# Patient Record
Sex: Female | Born: 1935 | Race: White | Hispanic: No | Marital: Married
Health system: Southern US, Community
[De-identification: ages and names within clinical notes are randomized; demographics above are authoritative.]

---

## 1998-02-20 ENCOUNTER — Other Ambulatory Visit: Admission: RE | Admit: 1998-02-20 | Discharge: 1998-02-20 | Payer: Self-pay | Admitting: Gynecology

## 1999-01-30 ENCOUNTER — Encounter: Admission: RE | Admit: 1999-01-30 | Discharge: 1999-01-30 | Payer: Self-pay | Admitting: Gynecology

## 1999-01-30 ENCOUNTER — Encounter: Payer: Self-pay | Admitting: Gynecology

## 1999-02-04 ENCOUNTER — Other Ambulatory Visit: Admission: RE | Admit: 1999-02-04 | Discharge: 1999-02-04 | Payer: Self-pay | Admitting: Gynecology

## 1999-03-18 ENCOUNTER — Encounter: Payer: Self-pay | Admitting: Gynecology

## 1999-03-18 ENCOUNTER — Encounter: Admission: RE | Admit: 1999-03-18 | Discharge: 1999-03-18 | Payer: Self-pay | Admitting: Gynecology

## 2000-02-16 ENCOUNTER — Other Ambulatory Visit: Admission: RE | Admit: 2000-02-16 | Discharge: 2000-02-16 | Payer: Self-pay | Admitting: Gynecology

## 2000-03-18 ENCOUNTER — Encounter: Admission: RE | Admit: 2000-03-18 | Discharge: 2000-03-18 | Payer: Self-pay | Admitting: Gynecology

## 2000-03-18 ENCOUNTER — Encounter: Payer: Self-pay | Admitting: Gynecology

## 2001-03-01 ENCOUNTER — Other Ambulatory Visit: Admission: RE | Admit: 2001-03-01 | Discharge: 2001-03-01 | Payer: Self-pay | Admitting: Gynecology

## 2001-03-28 ENCOUNTER — Encounter: Payer: Self-pay | Admitting: Gynecology

## 2001-03-28 ENCOUNTER — Encounter: Admission: RE | Admit: 2001-03-28 | Discharge: 2001-03-28 | Payer: Self-pay | Admitting: Gynecology

## 2002-03-31 ENCOUNTER — Encounter: Admission: RE | Admit: 2002-03-31 | Discharge: 2002-03-31 | Payer: Self-pay | Admitting: Gynecology

## 2002-03-31 ENCOUNTER — Encounter: Payer: Self-pay | Admitting: Gynecology

## 2002-04-10 ENCOUNTER — Other Ambulatory Visit: Admission: RE | Admit: 2002-04-10 | Discharge: 2002-04-10 | Payer: Self-pay | Admitting: Gynecology

## 2003-05-23 ENCOUNTER — Encounter: Admission: RE | Admit: 2003-05-23 | Discharge: 2003-05-23 | Payer: Self-pay | Admitting: Gynecology

## 2004-04-21 ENCOUNTER — Other Ambulatory Visit: Admission: RE | Admit: 2004-04-21 | Discharge: 2004-04-21 | Payer: Self-pay | Admitting: Gynecology

## 2004-07-02 ENCOUNTER — Encounter: Admission: RE | Admit: 2004-07-02 | Discharge: 2004-07-02 | Payer: Self-pay | Admitting: Gynecology

## 2005-01-29 ENCOUNTER — Ambulatory Visit: Payer: Self-pay | Admitting: Family Medicine

## 2005-01-30 ENCOUNTER — Ambulatory Visit: Payer: Self-pay | Admitting: Family Medicine

## 2005-02-02 ENCOUNTER — Ambulatory Visit: Payer: Self-pay | Admitting: Family Medicine

## 2005-02-05 ENCOUNTER — Ambulatory Visit: Payer: Self-pay | Admitting: Family Medicine

## 2005-02-16 ENCOUNTER — Ambulatory Visit: Payer: Self-pay | Admitting: Family Medicine

## 2005-03-16 ENCOUNTER — Ambulatory Visit: Payer: Self-pay | Admitting: Family Medicine

## 2005-06-18 ENCOUNTER — Ambulatory Visit: Payer: Self-pay | Admitting: Family Medicine

## 2005-07-06 ENCOUNTER — Ambulatory Visit: Payer: Self-pay | Admitting: Family Medicine

## 2005-07-08 ENCOUNTER — Encounter: Admission: RE | Admit: 2005-07-08 | Discharge: 2005-07-08 | Payer: Self-pay | Admitting: Gynecology

## 2005-07-30 ENCOUNTER — Encounter: Admission: RE | Admit: 2005-07-30 | Discharge: 2005-07-30 | Payer: Self-pay | Admitting: Gynecology

## 2006-01-12 DIAGNOSIS — J309 Allergic rhinitis, unspecified: Secondary | ICD-10-CM | POA: Insufficient documentation

## 2006-01-12 DIAGNOSIS — M810 Age-related osteoporosis without current pathological fracture: Secondary | ICD-10-CM | POA: Insufficient documentation

## 2006-01-12 DIAGNOSIS — I10 Essential (primary) hypertension: Secondary | ICD-10-CM

## 2006-01-12 DIAGNOSIS — J189 Pneumonia, unspecified organism: Secondary | ICD-10-CM

## 2006-04-28 ENCOUNTER — Other Ambulatory Visit: Admission: RE | Admit: 2006-04-28 | Discharge: 2006-04-28 | Payer: Self-pay | Admitting: Gynecology

## 2006-04-28 ENCOUNTER — Encounter: Payer: Self-pay | Admitting: Family Medicine

## 2006-04-28 LAB — CONVERTED CEMR LAB

## 2006-04-30 ENCOUNTER — Encounter: Payer: Self-pay | Admitting: Family Medicine

## 2006-10-06 ENCOUNTER — Encounter: Admission: RE | Admit: 2006-10-06 | Discharge: 2006-10-06 | Payer: Self-pay | Admitting: Gynecology

## 2007-10-21 ENCOUNTER — Encounter: Admission: RE | Admit: 2007-10-21 | Discharge: 2007-10-21 | Payer: Self-pay | Admitting: Gynecology

## 2009-02-26 ENCOUNTER — Encounter: Admission: RE | Admit: 2009-02-26 | Discharge: 2009-02-26 | Payer: Self-pay | Admitting: Gynecology

## 2014-08-10 ENCOUNTER — Inpatient Hospital Stay (HOSPITAL_COMMUNITY)
Admission: EM | Admit: 2014-08-10 | Discharge: 2014-08-14 | DRG: 064 | Disposition: A | Discharge status: Hospice | Payer: Medicare Other | Attending: Neurology | Admitting: Neurology

## 2014-08-10 ENCOUNTER — Emergency Department (HOSPITAL_COMMUNITY): Payer: Medicare Other

## 2014-08-10 ENCOUNTER — Encounter (HOSPITAL_COMMUNITY): Payer: Self-pay | Admitting: Radiology

## 2014-08-10 ENCOUNTER — Inpatient Hospital Stay (HOSPITAL_COMMUNITY): Payer: Medicare Other

## 2014-08-10 DIAGNOSIS — I619 Nontraumatic intracerebral hemorrhage, unspecified: Secondary | ICD-10-CM | POA: Diagnosis present

## 2014-08-10 DIAGNOSIS — G935 Compression of brain: Secondary | ICD-10-CM | POA: Diagnosis present

## 2014-08-10 DIAGNOSIS — I615 Nontraumatic intracerebral hemorrhage, intraventricular: Principal | ICD-10-CM | POA: Diagnosis present

## 2014-08-10 DIAGNOSIS — Z9114 Patient's other noncompliance with medication regimen: Secondary | ICD-10-CM | POA: Diagnosis present

## 2014-08-10 DIAGNOSIS — Z7982 Long term (current) use of aspirin: Secondary | ICD-10-CM

## 2014-08-10 DIAGNOSIS — Z515 Encounter for palliative care: Secondary | ICD-10-CM

## 2014-08-10 DIAGNOSIS — G936 Cerebral edema: Secondary | ICD-10-CM | POA: Diagnosis present

## 2014-08-10 DIAGNOSIS — Z9289 Personal history of other medical treatment: Secondary | ICD-10-CM

## 2014-08-10 DIAGNOSIS — R609 Edema, unspecified: Secondary | ICD-10-CM | POA: Diagnosis present

## 2014-08-10 DIAGNOSIS — I610 Nontraumatic intracerebral hemorrhage in hemisphere, subcortical: Secondary | ICD-10-CM | POA: Diagnosis not present

## 2014-08-10 DIAGNOSIS — J9690 Respiratory failure, unspecified, unspecified whether with hypoxia or hypercapnia: Secondary | ICD-10-CM | POA: Diagnosis not present

## 2014-08-10 DIAGNOSIS — E876 Hypokalemia: Secondary | ICD-10-CM | POA: Diagnosis present

## 2014-08-10 DIAGNOSIS — I613 Nontraumatic intracerebral hemorrhage in brain stem: Secondary | ICD-10-CM | POA: Diagnosis not present

## 2014-08-10 DIAGNOSIS — I10 Essential (primary) hypertension: Secondary | ICD-10-CM | POA: Diagnosis present

## 2014-08-10 DIAGNOSIS — R4182 Altered mental status, unspecified: Secondary | ICD-10-CM | POA: Diagnosis present

## 2014-08-10 DIAGNOSIS — R001 Bradycardia, unspecified: Secondary | ICD-10-CM | POA: Diagnosis not present

## 2014-08-10 DIAGNOSIS — E119 Type 2 diabetes mellitus without complications: Secondary | ICD-10-CM | POA: Diagnosis present

## 2014-08-10 DIAGNOSIS — J9600 Acute respiratory failure, unspecified whether with hypoxia or hypercapnia: Secondary | ICD-10-CM | POA: Diagnosis present

## 2014-08-10 DIAGNOSIS — Z4659 Encounter for fitting and adjustment of other gastrointestinal appliance and device: Secondary | ICD-10-CM

## 2014-08-10 DIAGNOSIS — Z87898 Personal history of other specified conditions: Secondary | ICD-10-CM

## 2014-08-10 DIAGNOSIS — R40243 Glasgow coma scale score 3-8: Secondary | ICD-10-CM | POA: Diagnosis present

## 2014-08-10 DIAGNOSIS — G8191 Hemiplegia, unspecified affecting right dominant side: Secondary | ICD-10-CM | POA: Diagnosis present

## 2014-08-10 DIAGNOSIS — Z885 Allergy status to narcotic agent status: Secondary | ICD-10-CM

## 2014-08-10 DIAGNOSIS — Z66 Do not resuscitate: Secondary | ICD-10-CM | POA: Diagnosis present

## 2014-08-10 DIAGNOSIS — G9340 Encephalopathy, unspecified: Secondary | ICD-10-CM | POA: Diagnosis present

## 2014-08-10 LAB — POCT I-STAT 3, ART BLOOD GAS (G3+)
Acid-Base Excess: 3 mmol/L — ABNORMAL HIGH (ref 0.0–2.0)
Bicarbonate: 28.2 mEq/L — ABNORMAL HIGH (ref 20.0–24.0)
O2 Saturation: 100 %
PCO2 ART: 43.8 mmHg (ref 35.0–45.0)
PO2 ART: 218 mmHg — AB (ref 80.0–100.0)
Patient temperature: 97
TCO2: 30 mmol/L (ref 0–100)
pH, Arterial: 7.412 (ref 7.350–7.450)

## 2014-08-10 LAB — CBC
HEMATOCRIT: 42.4 % (ref 36.0–46.0)
Hemoglobin: 14.4 g/dL (ref 12.0–15.0)
MCH: 29 pg (ref 26.0–34.0)
MCHC: 34 g/dL (ref 30.0–36.0)
MCV: 85.5 fL (ref 78.0–100.0)
PLATELETS: 136 10*3/uL — AB (ref 150–400)
RBC: 4.96 MIL/uL (ref 3.87–5.11)
RDW: 15.2 % (ref 11.5–15.5)
WBC: 9.2 10*3/uL (ref 4.0–10.5)

## 2014-08-10 LAB — COMPREHENSIVE METABOLIC PANEL
ALBUMIN: 4.1 g/dL (ref 3.5–5.0)
ALK PHOS: 81 U/L (ref 38–126)
ALT: 22 U/L (ref 14–54)
AST: 38 U/L (ref 15–41)
Anion gap: 10 (ref 5–15)
BILIRUBIN TOTAL: 0.7 mg/dL (ref 0.3–1.2)
BUN: 14 mg/dL (ref 6–20)
CALCIUM: 8.8 mg/dL — AB (ref 8.9–10.3)
CO2: 28 mmol/L (ref 22–32)
Chloride: 101 mmol/L (ref 101–111)
Creatinine, Ser: 0.78 mg/dL (ref 0.44–1.00)
GFR calc Af Amer: >60 mL/min (ref >60)
Glucose, Bld: 137 mg/dL — ABNORMAL HIGH (ref 70–99)
Potassium: 3.1 mmol/L — ABNORMAL LOW (ref 3.5–5.1)
SODIUM: 139 mmol/L (ref 135–145)
TOTAL PROTEIN: 6.5 g/dL (ref 6.5–8.1)

## 2014-08-10 LAB — I-STAT CHEM 8, ED
BUN: 19 mg/dL (ref 6–20)
CALCIUM ION: 1.06 mmol/L — AB (ref 1.13–1.30)
CHLORIDE: 98 mmol/L — AB (ref 101–111)
CREATININE: 0.8 mg/dL (ref 0.44–1.00)
GLUCOSE: 137 mg/dL — AB (ref 70–99)
HCT: 46 % (ref 36.0–46.0)
HEMOGLOBIN: 15.6 g/dL — AB (ref 12.0–15.0)
POTASSIUM: 3 mmol/L — AB (ref 3.5–5.1)
Sodium: 140 mmol/L (ref 135–145)
TCO2: 25 mmol/L (ref 0–100)

## 2014-08-10 LAB — URINE MICROSCOPIC-ADD ON

## 2014-08-10 LAB — DIFFERENTIAL
Basophils Absolute: 0 10*3/uL (ref 0.0–0.1)
Basophils Relative: 0 % (ref 0–1)
EOS ABS: 0 10*3/uL (ref 0.0–0.7)
EOS PCT: 0 % (ref 0–5)
LYMPHS ABS: 3 10*3/uL (ref 0.7–4.0)
LYMPHS PCT: 33 % (ref 12–46)
MONOS PCT: 4 % (ref 3–12)
Monocytes Absolute: 0.4 10*3/uL (ref 0.1–1.0)
Neutro Abs: 5.8 10*3/uL (ref 1.7–7.7)
Neutrophils Relative %: 63 % (ref 43–77)

## 2014-08-10 LAB — URINALYSIS, ROUTINE W REFLEX MICROSCOPIC
Bilirubin Urine: NEGATIVE
Glucose, UA: 100 mg/dL — AB
KETONES UR: NEGATIVE mg/dL
LEUKOCYTES UA: NEGATIVE
NITRITE: NEGATIVE
PROTEIN: NEGATIVE mg/dL
Specific Gravity, Urine: 1.006 (ref 1.005–1.030)
UROBILINOGEN UA: 0.2 mg/dL (ref 0.0–1.0)
pH: 8 (ref 5.0–8.0)

## 2014-08-10 LAB — I-STAT TROPONIN, ED: TROPONIN I, POC: 0 ng/mL (ref 0.00–0.08)

## 2014-08-10 LAB — APTT: aPTT: 31 seconds (ref 24–37)

## 2014-08-10 LAB — SODIUM
Sodium: 137 mmol/L (ref 135–145)
Sodium: 151 mmol/L — ABNORMAL HIGH (ref 135–145)
Sodium: 152 mmol/L — ABNORMAL HIGH (ref 135–145)

## 2014-08-10 LAB — I-STAT ARTERIAL BLOOD GAS, ED
ACID-BASE EXCESS: 1 mmol/L (ref 0.0–2.0)
BICARBONATE: 28.6 meq/L — AB (ref 20.0–24.0)
O2 SAT: 99 %
TCO2: 30 mmol/L (ref 0–100)
pCO2 arterial: 57.5 mmHg (ref 35.0–45.0)
pH, Arterial: 7.305 — ABNORMAL LOW (ref 7.350–7.450)
pO2, Arterial: 174 mmHg — ABNORMAL HIGH (ref 80.0–100.0)

## 2014-08-10 LAB — ETHANOL: Alcohol, Ethyl (B): <5 mg/dL (ref <5)

## 2014-08-10 LAB — MRSA PCR SCREENING: MRSA BY PCR: NEGATIVE

## 2014-08-10 LAB — CBG MONITORING, ED: GLUCOSE-CAPILLARY: 135 mg/dL — AB (ref 70–99)

## 2014-08-10 LAB — PROTIME-INR
INR: 1.04 (ref 0.00–1.49)
Prothrombin Time: 13.7 seconds (ref 11.6–15.2)

## 2014-08-10 LAB — RAPID URINE DRUG SCREEN, HOSP PERFORMED
AMPHETAMINES: NOT DETECTED
Barbiturates: NOT DETECTED
Benzodiazepines: NOT DETECTED
COCAINE: NOT DETECTED
OPIATES: NOT DETECTED
TETRAHYDROCANNABINOL: NOT DETECTED

## 2014-08-10 LAB — POC OCCULT BLOOD, ED: FECAL OCCULT BLD: NEGATIVE

## 2014-08-10 LAB — TRIGLYCERIDES: Triglycerides: 64 mg/dL (ref <150)

## 2014-08-10 MED ORDER — POTASSIUM CHLORIDE 20 MEQ/15ML (10%) PO SOLN
40.0000 meq | Freq: Once | ORAL | Status: AC
  Administered 2014-08-10: 40 meq
  Filled 2014-08-10: qty 30

## 2014-08-10 MED ORDER — PANTOPRAZOLE SODIUM 40 MG IV SOLR: 40.0000 mg | Freq: Every day | INTRAVENOUS | Status: DC

## 2014-08-10 MED ORDER — NICARDIPINE HCL IN NACL 20-0.86 MG/200ML-% IV SOLN
3.0000 mg/h | INTRAVENOUS | Status: DC
  Administered 2014-08-10 (×2): 5 mg/h via INTRAVENOUS
  Filled 2014-08-10 (×2): qty 200

## 2014-08-10 MED ORDER — ACETAMINOPHEN 325 MG PO TABS: 650.0000 mg | ORAL_TABLET | ORAL | Status: DC | PRN

## 2014-08-10 MED ORDER — PROPOFOL 1000 MG/100ML IV EMUL
INTRAVENOUS | Status: AC
  Filled 2014-08-10: qty 100

## 2014-08-10 MED ORDER — LABETALOL HCL 5 MG/ML IV SOLN
10.0000 mg | INTRAVENOUS | Status: DC | PRN
  Administered 2014-08-10 (×2): 20 mg via INTRAVENOUS
  Filled 2014-08-10 (×2): qty 4

## 2014-08-10 MED ORDER — FENTANYL CITRATE (PF) 100 MCG/2ML IJ SOLN
50.0000 ug | INTRAMUSCULAR | Status: DC | PRN
  Administered 2014-08-10: 50 ug via INTRAVENOUS
  Filled 2014-08-10: qty 2

## 2014-08-10 MED ORDER — SODIUM CHLORIDE 3 % IV SOLN
INTRAVENOUS | Status: DC
  Administered 2014-08-10 – 2014-08-11 (×3): 75 mL/h via INTRAVENOUS
  Filled 2014-08-10 (×9): qty 500

## 2014-08-10 MED ORDER — PANTOPRAZOLE SODIUM 40 MG IV SOLR
40.0000 mg | Freq: Every day | INTRAVENOUS | Status: DC
  Administered 2014-08-10: 40 mg via INTRAVENOUS
  Filled 2014-08-10 (×3): qty 40

## 2014-08-10 MED ORDER — PROPOFOL 1000 MG/100ML IV EMUL
5.0000 ug/kg/min | INTRAVENOUS | Status: DC
  Administered 2014-08-10: 5 ug/kg/min via INTRAVENOUS

## 2014-08-10 MED ORDER — CHLORHEXIDINE GLUCONATE 0.12 % MT SOLN
15.0000 mL | Freq: Two times a day (BID) | OROMUCOSAL | Status: DC
  Administered 2014-08-10 – 2014-08-11 (×3): 15 mL via OROMUCOSAL
  Filled 2014-08-10 (×3): qty 15

## 2014-08-10 MED ORDER — ACETAMINOPHEN 650 MG RE SUPP: 650.0000 mg | RECTAL | Status: DC | PRN

## 2014-08-10 MED ORDER — POTASSIUM CHLORIDE 20 MEQ/15ML (10%) PO SOLN
40.0000 meq | Freq: Three times a day (TID) | ORAL | Status: AC
  Administered 2014-08-10 (×2): 40 meq
  Filled 2014-08-10 (×2): qty 30

## 2014-08-10 MED ORDER — SODIUM CHLORIDE 0.9 % IV SOLN
1.0000 g | Freq: Once | INTRAVENOUS | Status: AC
  Administered 2014-08-10: 1 g via INTRAVENOUS
  Filled 2014-08-10: qty 10

## 2014-08-10 MED ORDER — ROCURONIUM BROMIDE 50 MG/5ML IV SOLN
INTRAVENOUS | Status: AC | PRN
  Administered 2014-08-10: 50 mg via INTRAVENOUS

## 2014-08-10 MED ORDER — PROPOFOL 1000 MG/100ML IV EMUL: 0.0000 ug/kg/min | INTRAVENOUS | Status: DC

## 2014-08-10 MED ORDER — SODIUM CHLORIDE 23.4 % INJECTION (4 MEQ/ML) FOR IV ADMINISTRATION
30.0000 mL | Freq: Once | INTRAVENOUS | Status: AC
  Administered 2014-08-10: 30 mL via INTRAVENOUS
  Filled 2014-08-10: qty 30

## 2014-08-10 MED ORDER — FENTANYL BOLUS VIA INFUSION
25.0000 ug | INTRAVENOUS | Status: DC | PRN
  Filled 2014-08-10: qty 25

## 2014-08-10 MED ORDER — PROPOFOL 1000 MG/100ML IV EMUL: 5.0000 ug/kg/min | INTRAVENOUS | Status: DC

## 2014-08-10 MED ORDER — SODIUM CHLORIDE 0.9 % IV SOLN: INTRAVENOUS | Status: DC

## 2014-08-10 MED ORDER — PROPOFOL 1000 MG/100ML IV EMUL
INTRAVENOUS | Status: AC | PRN
  Administered 2014-08-10: 5 ug/kg/min via INTRAVENOUS

## 2014-08-10 MED ORDER — NICARDIPINE HCL IN NACL 20-0.86 MG/200ML-% IV SOLN
3.0000 mg/h | Freq: Once | INTRAVENOUS | Status: AC
  Administered 2014-08-10: 5 mg/h via INTRAVENOUS

## 2014-08-10 MED ORDER — CETYLPYRIDINIUM CHLORIDE 0.05 % MT LIQD
7.0000 mL | Freq: Four times a day (QID) | OROMUCOSAL | Status: DC
  Administered 2014-08-10 – 2014-08-11 (×4): 7 mL via OROMUCOSAL

## 2014-08-10 MED ORDER — STROKE: EARLY STAGES OF RECOVERY BOOK
Freq: Once | Status: AC
  Administered 2014-08-10: 05:00:00
  Filled 2014-08-10: qty 1

## 2014-08-10 MED ORDER — FENTANYL CITRATE (PF) 100 MCG/2ML IJ SOLN
50.0000 ug | INTRAMUSCULAR | Status: DC | PRN
  Administered 2014-08-10 (×2): 50 ug via INTRAVENOUS
  Filled 2014-08-10 (×2): qty 2

## 2014-08-10 MED ORDER — ETOMIDATE 2 MG/ML IV SOLN
INTRAVENOUS | Status: AC | PRN
  Administered 2014-08-10: 20 mg via INTRAVENOUS

## 2014-08-10 MED ORDER — SODIUM CHLORIDE 0.9 % IV SOLN
25.0000 ug/h | INTRAVENOUS | Status: DC
  Administered 2014-08-10: 50 ug/h via INTRAVENOUS
  Filled 2014-08-10: qty 50

## 2014-08-10 MED ORDER — SENNOSIDES-DOCUSATE SODIUM 8.6-50 MG PO TABS
1.0000 | ORAL_TABLET | Freq: Two times a day (BID) | ORAL | Status: DC
  Administered 2014-08-10 – 2014-08-11 (×2): 1 via ORAL
  Filled 2014-08-10 (×4): qty 1

## 2014-08-10 NOTE — Code Documentation (Signed)
Pt transported to CT by Carollee SiresSteve Snider, RN

## 2014-08-10 NOTE — Progress Notes (Signed)
Sterile Procedure in progress will attempt ABG when able.

## 2014-08-10 NOTE — Progress Notes (Signed)
Family bedside.   No further support needed.   Chaplain signing off.  Gala RomneyBrown, Coyt Govoni J, Chaplain 08/10/2014

## 2014-08-10 NOTE — ED Notes (Signed)
Family at bedside. 

## 2014-08-10 NOTE — Progress Notes (Signed)
UR completed.  Family aware of prognosis. CM/CSW will cont to follow in case hospice needs are identified.  Carlyle LipaMichelle Henryetta Corriveau, RN BSN MHA CCM Trauma/Neuro ICU Case Manager [phone]

## 2014-08-10 NOTE — ED Notes (Signed)
RT at bedside, obtained ABG.  Patient flexed left leg in response.

## 2014-08-10 NOTE — Code Documentation (Signed)
Camilo, MD at bedside.  

## 2014-08-10 NOTE — Progress Notes (Signed)
Chaplain paged to visit family.   Pt spouse of 60 years, two daughters, grandson and son-in-law in consult B.   After physician spoke with pt's spouse, the other family members arrived. They ae visibly shaken by the suddenness of the occurrence. Pt spouse expressed positivity about their relationship through the years and that she is not one to be sick and the severity of it is setting in.   Chaplain provided empathic listening and support.   Family described pt as "very strong woman" with a strong presence.   Gala RomneyBrown, Laurieanne Galloway J, Chaplain 08/10/2014

## 2014-08-10 NOTE — Progress Notes (Signed)
ABG drawn. Critical value noted and ABG printout handed to Dr. Criss AlvineGoldston at 03:20

## 2014-08-10 NOTE — Procedures (Signed)
Central Venous Catheter Insertion Procedure Note Adriana CantorJanet R Mejia [id number] 12-Dec-1927  Procedure: Insertion of Central Venous Catheter Indications: Assessment of intravascular volume, Drug and/or fluid administration and Frequent blood sampling  Procedure Details Consent: Risks of procedure as well as the alternatives and risks of each were explained to the (patient/caregiver).  Consent for procedure obtained. Time Out: Verified patient identification, verified procedure, site/side was marked, verified correct patient position, special equipment/implants available, medications/allergies/relevent history reviewed, required imaging and test results available.  Performed  Maximum sterile technique was used including antiseptics, cap, gloves, gown, hand hygiene, mask and sheet. Skin prep: Chlorhexidine; local anesthetic administered A antimicrobial bonded/coated triple lumen catheter was placed in the right internal jugular vein using the Seldinger technique.  Evaluation Blood flow good Complications: No apparent complications Patient did tolerate procedure well. Chest X-ray ordered to verify placement.  CXR: pending.  Procedure performed under direct ultrasound guidance for real time vessel cannulation.      Adriana Mejia, GeorgiaPA - [facility] Pager: [phone]  or [phone] 08/10/2014, 4:44 AM

## 2014-08-10 NOTE — ED Notes (Signed)
Neurologist at bedside again

## 2014-08-10 NOTE — Consult Note (Signed)
Referring Physician: Dr. Criss AlvineGoldston    Chief Complaint: code stroke, unresponsiveness with right hemiparesis  HPI:                                                                                                                                         Adriana Mejia is an 81 y.o. female with apparently no pertinent past medical history, hasn't seen a physician in many years, brought in by EMS after being found unresponsive by her husband. Patient is intubated on the vent, on propofol. She comes from home, was reportedly seen normal by husband at midnight, but approximately 1 hour later was found poorly responsive by her husband and EMS was summoned. As per EMS, patient had SBP>210 but was still able to follow some commands. Upon arrival to ED patient with dense right hemiparesis, progressive decline in mental status, remained markedly hypertensive, was intubated and started on nicardipine drip. CT brain was personally reviewed and showed a very large left BG ICH that measures 5.4 x 4.7 x 4.0 cm for calculated volume of 52 mL, with extension into the lateral and fourth ventricle, 6 mm of left to right midline shift, and mild subfalcine herniation. Platelets 136, PTT 31, and INR 1.04  Date last known well: 08/10/14 Time last known well: 12 am  tPA Given: no, ICH   History reviewed. No pertinent past medical history.  No past surgical history on file.  No family history on file. Social History:  has no tobacco, alcohol, and drug history on file.  Allergies: No Known Allergies  Medications:                                                                                                                           I have reviewed the patient's current medications.  ROS: unable to obtain due to mental status                                                                                        History obtained from  chart review and family  Physical exam: pleasant female in no apparent distress. BP  186/90 P 82 R 17 Head: normocephalic. Neck: supple, no bruits, no JVD. Cardiac: no murmurs. Lungs: clear. Abdomen: soft, no tender, no mass. Extremities: no edema. Skin: no rash Neurologic Examination:                                                                                                      Mental status: intubated, on propofol CN 2-12: pupils 3 mm, poorly reactive. EOM absent. Weak corneal reflex in the left and absent in the right. Motor: on propofol. Sensory: doesn't react to pain but on propofol DTR's: 1 all over Plantars: mute Coordination and gait: unable to test due to mental status  Results for orders placed or performed during the hospital encounter of 08/10/14 (from the past 48 hour(s))  CBC     Status: Abnormal   Collection Time: 08/10/14  2:08 AM  Result Value Ref Range   WBC 9.2 4.0 - 10.5 K/uL   RBC 4.96 3.87 - 5.11 MIL/uL   Hemoglobin 14.4 12.0 - 15.0 g/dL   HCT 40.9 81.1 - 91.4 %   MCV 85.5 78.0 - 100.0 fL   MCH 29.0 26.0 - 34.0 pg   MCHC 34.0 30.0 - 36.0 g/dL   RDW 78.2 95.6 - 21.3 %   Platelets 136 (L) 150 - 400 K/uL  Differential     Status: None   Collection Time: 08/10/14  2:08 AM  Result Value Ref Range   Neutrophils Relative % 63 43 - 77 %   Neutro Abs 5.8 1.7 - 7.7 K/uL   Lymphocytes Relative 33 12 - 46 %   Lymphs Abs 3.0 0.7 - 4.0 K/uL   Monocytes Relative 4 3 - 12 %   Monocytes Absolute 0.4 0.1 - 1.0 K/uL   Eosinophils Relative 0 0 - 5 %   Eosinophils Absolute 0.0 0.0 - 0.7 K/uL   Basophils Relative 0 0 - 1 %   Basophils Absolute 0.0 0.0 - 0.1 K/uL  I-stat troponin, ED (not at [facility], [facility])     Status: None   Collection Time: 08/10/14  2:11 AM  Result Value Ref Range   Troponin i, poc 0.00 0.00 - 0.08 ng/mL   Comment 3            Comment: Due to the release kinetics of cTnI, a negative result within the first hours of the onset of symptoms does not rule out myocardial infarction with certainty. If myocardial infarction is still  suspected, repeat the test at appropriate intervals.   I-Stat Chem 8, ED  (not at [facility], [facility])     Status: Abnormal   Collection Time: 08/10/14  2:13 AM  Result Value Ref Range   Sodium 140 135 - 145 mmol/L   Potassium 3.0 (L) 3.5 - 5.1 mmol/L   Chloride 98 (L) 101 - 111 mmol/L   BUN 19 6 - 20 mg/dL   Creatinine, Ser 0.86 0.44 - 1.00 mg/dL   Glucose, Bld 578 (H) 70 - 99 mg/dL  Calcium, Ion 1.06 (L) 1.13 - 1.30 mmol/L   TCO2 25 0 - 100 mmol/L   Hemoglobin 15.6 (H) 12.0 - 15.0 g/dL   HCT 10.246.0 72.536.0 - 36.646.0 %   No results found.   Assessment: 81 y.o. female brought in with altered mental status and right hemiparesis. CT brain revealed a very large left BG ICH with IV extension, midline shift, and mild subfalcine herniation. Etiology of this ICH is unquestionably marked hypertension. Patient is intubated, sedated with propofol, on the vent. I am afraid this is going to be a devastating, non survivable ICH and explained to the family the gravity of the situation. Neurosurgery was consulted by ER attending and patient is obviously not a surgical candidate at this moment. Will treat symptomatically with IV nicardipine and osmotherapy. Admit to NICU.  ICU consulted. Stroke team will follow up tomorrow.  Wyatt Portelasvaldo Zaydin Billey, MD [facility] [phone]  08/10/2014, 2:33 AM

## 2014-08-10 NOTE — Progress Notes (Addendum)
Seen on morning rounds, daughter bedside.  Patient is completely unresponsive, clearly starting to have fluctuation in HR and BP consistent with Cushions triad.  I am concerned that herniation is Administrator, arts.  Discussed with neurology.  The patient is not a surgical candidate.  I met with daughter bedside and informed her that patient will not survive this event and asked her that she proceeds with getting family members to be available as I suspect with fluctuation of vital signs that she may arrest any second.  She is already a limited code and no CPR/cardioversion/pressors will be offered.  She will contact family.  I also recommended that we proceed with withdrawal when family is ready and she wanted to discuss that with her father and brother.  Will be available.  All labs addressed, electrolytes replaced and AM labs ordered.  The patient is critically ill with multiple organ systems failure and requires high complexity decision making for assessment and support, frequent evaluation and titration of therapies, application of advanced monitoring technologies and extensive interpretation of multiple databases.   Critical Care Time devoted to patient care services described in this note is  36  Minutes. This time reflects time of care of this signee Dr Jennet Maduro. This critical care time does not reflect procedure time, or teaching time or supervisory time of PA/NP/Med student/Med Resident etc but could involve care discussion time.  Rush Farmer, M.D. [facility] Pulmonary/Critical Care Medicine. Pager: [phone]. After hours pager: [phone].

## 2014-08-10 NOTE — Progress Notes (Signed)
Tube placement verified by positive color change, bilat BS, and CXR.

## 2014-08-10 NOTE — ED Notes (Signed)
Critical care at bedside  

## 2014-08-10 NOTE — Progress Notes (Signed)
Stroke Team Progress Note  HISTORY Adriana CantorJanet R Mejia is an 81 y.o. female with apparently no pertinent past medical history, hasn't seen a physician in many years, brought in by EMS after being found unresponsive by her husband. Patient is intubated on the vent, on propofol. She comes from home, was reportedly seen normal by husband at midnight, but approximately 1 hour later was found poorly responsive by her husband and EMS was summoned. As per EMS, patient had SBP>210 but was still able to follow some commands. Upon arrival to ED patient with dense right hemiparesis, progressive decline in mental status, remained markedly hypertensive, was intubated and started on nicardipine drip. CT brain was personally reviewed and showed a very large left BG ICH that measures 5.4 x 4.7 x 4.0 cm for calculated volume of 52 mL, with extension into the lateral and fourth ventricle, 6 mm of left to right midline shift, and mild subfalcine herniation. Platelets 136, PTT 31, and INR 1.04 Date last known well: 08/10/14 Time last known well: 12 am  tPA Given: no, ICH    She was admitted to the neuro ICU for further evaluation and treatment.  SUBJECTIVE Her  Husband, daughters x 2 and son in law are at the bedside.  Overall she feels her condition is gradually worsening. Her blood pressure has been inadequately controlled. Hypertonic saline has been started.  OBJECTIVE Most recent Vital Signs: Filed Vitals:   08/10/14 1415 08/10/14 1430 08/10/14 1530 08/10/14 1533  BP: 153/70 149/68 135/85 135/85  Pulse: 72 72 106 80  Temp: 100 F (37.8 C) 100 F (37.8 C) 99.7 F (37.6 C) 99.7 F (37.6 C)  TempSrc:      Resp: 29 20 20 21   Height:      Weight:      SpO2: 99% 97% 96% 100%   CBG (last 3)   Recent Labs  08/10/14 0210  GLUCAP 135*    IV Fluid Intake:   . niCARDipine 3 mg/hr (08/10/14 1400)  . propofol (DIPRIVAN) infusion Stopped (08/10/14 0900)  . sodium chloride (hypertonic) 75 mL/hr (08/10/14 1500)     MEDICATIONS  . antiseptic oral rinse  7 mL Mouth Rinse QID  . chlorhexidine  15 mL Mouth Rinse BID  . pantoprazole (PROTONIX) IV  40 mg Intravenous Daily  . potassium chloride  40 mEq Per Tube TID  . senna-docusate  1 tablet Oral BID   PRN:  acetaminophen **OR** acetaminophen, fentaNYL (SUBLIMAZE) injection, fentaNYL (SUBLIMAZE) injection, labetalol  Diet:  Diet NPO time specified   Activity:  Bedrest  DVT Prophylaxis: SCDs CLINICALLY SIGNIFICANT STUDIES Basic Metabolic Panel:  Recent Labs Lab 08/10/14 0208 08/10/14 0213 08/10/14 0530 08/10/14 1200  NA 139 140 137 151*  K 3.1* 3.0*  --   --   CL 101 98*  --   --   CO2 28  --   --   --   GLUCOSE 137* 137*  --   --   BUN 14 19  --   --   CREATININE 0.78 0.80  --   --   CALCIUM 8.8*  --   --   --    Liver Function Tests:  Recent Labs Lab 08/10/14 0208  AST 38  ALT 22  ALKPHOS 81  BILITOT 0.7  PROT 6.5  ALBUMIN 4.1   CBC:  Recent Labs Lab 08/10/14 0208 08/10/14 0213  WBC 9.2  --   NEUTROABS 5.8  --   HGB 14.4 15.6*  HCT 42.4 46.0  MCV 85.5  --   PLT 136*  --    Coagulation:  Recent Labs Lab 08/10/14 0208  LABPROT 13.7  INR 1.04   Cardiac Enzymes: No results for input(s): CKTOTAL, CKMB, CKMBINDEX, TROPONINI in the last 168 hours. Urinalysis:  Recent Labs Lab 08/10/14 0300  COLORURINE YELLOW  LABSPEC 1.006  PHURINE 8.0  GLUCOSEU 100*  HGBUR TRACE*  BILIRUBINUR NEGATIVE  KETONESUR NEGATIVE  PROTEINUR NEGATIVE  UROBILINOGEN 0.2  NITRITE NEGATIVE  LEUKOCYTESUR NEGATIVE   Lipid Panel    Component Value Date/Time   TRIG 64 08/10/2014 0530   HgbA1C No results found for: HGBA1C  Urine Drug Screen:     Component Value Date/Time   LABOPIA NONE DETECTED 08/10/2014 0300   COCAINSCRNUR NONE DETECTED 08/10/2014 0300   LABBENZ NONE DETECTED 08/10/2014 0300   AMPHETMU NONE DETECTED 08/10/2014 0300   THCU NONE DETECTED 08/10/2014 0300   LABBARB NONE DETECTED 08/10/2014 0300    Alcohol Level:   Recent Labs Lab 08/10/14 0208  ETH <5    Ct Head Wo Contrast  08/10/2014   CLINICAL DATA:  Code stroke. Patient found on floor. Patient unresponsive  EXAM: CT HEAD WITHOUT CONTRAST  TECHNIQUE: Contiguous axial images were obtained from the base of the skull through the vertex without intravenous contrast.  COMPARISON:  None.  FINDINGS: There is a large intraventricular hemorrhage centered within the left basal ganglia and centrum semiovale. This hemorrhage measures 5.4 x 4.7 x 4.0 cm for calculated volume of 52 mL. Hemorrhage extends into the left lateral ventricle, atria, and temporal horn of the left ventricle. There is 6 mm of rightward midline shift. There is mild subfalcine herniation. The intraventricular hemorrhage extends into the fourth ventricle. Small amount of hemorrhage in the right ventricle. There is mild effacement of the quadrigeminal plate cisterns.  There is generalized cortical atrophy and mild white matter microvascular disease.  No evidence of skull fracture.  Basal cisterns are patent.  IMPRESSION: 1. Large parenchymal hemorrhage centered in the left basal ganglia with a estimated volume of 52 cubic cm. 2. Large volume of intraventricular hemorrhage within the left lateral ventricle. Hemorrhage also extends into the right ventricle and fourth ventricle. 3. Mass effect with 6 mm of midline shift. Mild subfalcine herniation and effacement of the quadrigeminal plate cisterns. Critical Value/emergent results were called by telephone at the time of interpretation on 08/10/2014 at 2:48 am to Dr. Irene Pap, who verbally acknowledged these results.   Electronically Signed   By: Genevive Bi M.D.   On: 08/10/2014 02:52   Dg Chest Port 1 View  08/10/2014   CLINICAL DATA:  Central line placement  EXAM: PORTABLE CHEST - 1 VIEW  COMPARISON:  08/10/2014 at 2:09  FINDINGS: There is a new right jugular central line with tip in the low SVC. The endotracheal tube tip is 3.9 cm above the carina. The  nasogastric tube has been advanced and now extends into the stomach, off the inferior edge of the image. The lungs are grossly clear. There is no large effusion. There is no pneumothorax.  IMPRESSION: *New right jugular central line.  No pneumothorax. *NG tube is been advanced and now extends into the stomach. *Satisfactory ET tube position. *No acute cardiopulmonary findings.   Electronically Signed   By: Ellery Plunk M.D.   On: 08/10/2014 05:56   Dg Chest Portable 1 View  08/10/2014   CLINICAL DATA:  Unresponsive.  EXAM: PORTABLE CHEST - 1 VIEW  COMPARISON:  None.  FINDINGS: The endotracheal  tube is 5 cm above the carina. The nasogastric tube tip is in the distal esophagus. NG tube should be advanced at least 10 cm for optimal placement.  The lungs are clear. There is no large effusion or pneumothorax. There is mild aortic tortuosity. Hilar and mediastinal contours appear unremarkable. Pulmonary vasculature is normal for a supine radiograph.  IMPRESSION: *Satisfactory ET tube position. *Nasogastric tube does not reach the stomach and should be advanced *No acute cardiopulmonary findings   Electronically Signed   By: Ellery Plunk M.D.   On: 08/10/2014 02:41   Dg Abd Portable 1v  08/10/2014   CLINICAL DATA:  Gastric tube placement  EXAM: PORTABLE ABDOMEN - 1 VIEW  COMPARISON:  None  FINDINGS: The gastric tube extends well into the stomach. Abdominal gas pattern appears unremarkable.  IMPRESSION: Gastric tube extends into the stomach   Electronically Signed   By: Ellery Plunk M.D.   On: 08/10/2014 03:06      MRI of the brain  Not ordered  MRA of the brain  Not ordered  Carotid Doppler  Not ordered  2D Echocardiogram   pending  CXR  No acute cardiopulmonary findings.  EKG  Sinus rhythm Biatrial enlargement Consider right ventricular hypertrophy Prolonged QT interval No old tracing to compare  Therapy Recommendations pending  Physical Exam   Frail elderly caucasian lady not in  distress. .Intubated. Afebrile. Head is nontraumatic. Neck is supple without bruit.    Cardiac exam no murmur or gallop. Lungs are clear to auscultation. Distal pulses are well felt. Neurological Exam : Intubated. Not on any sedation. Unresponsive. Eyes are closed. Left gaze deviation. It appears 4 mm equal sluggishly reactive. Right corneal reflexes present and left is sluggish. Dolls eye movements are sluggish. Fundi were not visualized. Does not follow any commands. Tongue midline. Mild right lower facial weakness. Dense right hemiplegia and will not withdraw even to painful stimuli. Minimum withdrawal in the right leg pain. Purposeful antigravity movements on the left. Right plantar upgoing left downgoing. ASSESSMENT Ms. Adriana Mejia is a 81 y.o. female presenting with  sudden onset of unresponsiveness and hypertensive urgency. Neurological exam shows comatose state with dense right hemiplegia and CT scan shows a large left frontal parenchymal hematoma ( volume 52 cubic cc) with intraventricular extension, subfalcine brain herniation, cytotoxic edema On aspirin 325 mg orally every day prior to admission. Now on no antithrombotic for secondary stroke prevention. Patient with resultant  comatose state, right hemiplegia, respiratory failure.  Etiology of hemorrhage likely hypertensive lobar hematoma     Hypertension but noncompliant with medications.  Has not seen a primary M.D. in years       Hospital day # 0  TREATMENT/PLAN  I have personally examined this patient, reviewed notes, independently viewed imaging studies, participated in medical decision making and plan of care. I have made any additions or clarifications directly to the above note. She unfortunately has a very large brain hematoma with significant cytotoxic edema, mass effect and midline shift and prognosis is quite poor of surviving this and having meaningful improvement. I had an extensive 30 minute discussion with husband,  daughter and son-in-law and explained her prognosis. She is likely going to get progressively neurological reversed due to increasing cerebral edema. Family is undecided at the present time as to how aggressive they want to be. I gave them the option of neurosurgical consult for considering hematoma evacuation but stated clearly that that would likely save her life but is unlikely to alter  underlying significant neurological disability. After consideration they decided not to pursue neurosurgical option at the present time. We will continue 3% saline but given her a bolus of 23.4% hypertonic saline to increase sodium and keep it in the range of 150-155. Repeat CT scan with CT angiogram of the brain in the morning. Check echocardiogram. Keep blood pressure tightly controlled and below systolic 160 for 24 hours and use prn hydralazine or Cardene drip if necessary This patient is critically ill and at significant risk of neurological worsening, death and care requires constant monitoring of vital signs, hemodynamics,respiratory and cardiac monitoring,review of multiple databases, neurological assessment, discussion with family, other specialists and medical decision making of high complexity.I have made any additions or clarifications directly to the above note.  I spent 45 minutes of neurocritical care time  in the care of  this patient.  Adriana HeadyPramod Sethi, MD Medical Director [facility] Pager: [phone] 08/10/2014 4:50 PM  SIGNED    To contact Stroke Continuity provider, please refer to [email or web address]. After hours, contact General Neurology

## 2014-08-10 NOTE — ED Notes (Signed)
Per EMS, pt normally goes to bed around midnight. Around 0100, pt's husband heard her calling out and found the pt on the floor. Per EMS, pt had multiple episodes of bloody emesis, and one episode of explosive stool, but EMS was unable to examine the stool to see if there was blood in it, but states that the stool smelled like GI bleed. Upon EMS arrival to the pt's home., the pt was responding to both voice and pain, but en route stopped responding to voice. Upon arrival to the ER, pt barely responding to voice, and showing right-sided deficits. Pt unable to follow commands. EMS reports that they had to suction the pt a few times.

## 2014-08-10 NOTE — Code Documentation (Signed)
CBG 135  

## 2014-08-10 NOTE — Progress Notes (Addendum)
VT adjusted as close to mto 6 cc/kg/IBW which is 340.

## 2014-08-10 NOTE — Progress Notes (Signed)
Pt transferred a second time from ER to [facility] [place]. Report called to April 20 mn before travel.

## 2014-08-10 NOTE — Progress Notes (Addendum)
SLP Cancellation Note  Patient Details Name: Abran CantorJanet R Jacque MRN: [id number] DOB: [birth year]   Cancelled treatment:        Pt orally intubated.  Will check 5/7 for readiness for SLP evaluations   Maryjo RochesterWillis, [place] Lohr T 08/10/2014, 10:15 AM

## 2014-08-10 NOTE — Progress Notes (Signed)
Initial Nutrition Assessment  DOCUMENTATION CODES:  Not applicable  INTERVENTION:  Other (Comment) (Recommend initiating the Adult Tube Feeding Protocol)  NUTRITION DIAGNOSIS:  Inadequate oral intake related to inability to eat as evidenced by NPO status.   GOAL:  Patient will meet greater than or equal to 90% of their needs   MONITOR:  Other (Comment) (Propofol, plan of care)  REASON FOR ASSESSMENT:  Ventilator    ASSESSMENT:  Pt admitted with large ICH with IVH extension and MLS. Pt intubated and started on hypertonic saline.   Patient is currently intubated on ventilator support MV: 7.2 L/min Temp (24hrs), Avg:97.5 F (36.4 C), Min:91.6 F (33.1 C), Max:99.5 F (37.5 C)  Propofol: 13.63 ml/hr provides 360 kcal  Unable to complete Nutrition-Focused physical exam at this time.  Pt discussed during ICU rounds and with RN.   Height:  Ht Readings from Last 1 Encounters:  08/10/14 4\' 11"  (1.499 m)    Weight:  Wt Readings from Last 1 Encounters:  08/10/14 100 lb 15.5 oz (45.8 kg)    Ideal Body Weight:  44.6 kg  Wt Readings from Last 10 Encounters:  08/10/14 100 lb 15.5 oz (45.8 kg)    BMI:  Body mass index is 20.38 kg/(m^2).  Estimated Nutritional Needs:  Kcal:  1061  Protein:  57-65 grams  Fluid:  >/= 1.5 L/day  Skin:  Reviewed, no issues  Diet Order:  Diet NPO time specified  EDUCATION NEEDS:  No education needs identified at this time   Intake/Output Summary (Last 24 hours) at 08/10/14 1111 Last data filed at 08/10/14 1010  Gross per 24 hour  Intake 358.33 ml  Output    650 ml  Net -291.67 ml    Last BM:  5/6  Kendell BaneHeather Pascal Stiggers RD, LDN, CNSC [phone] Pager [phone] After Hours Pager

## 2014-08-10 NOTE — Progress Notes (Signed)
At about 01:40 I responded to trauma room C to receive an unresponsive patient. When she arrived, I observed RR 25, SAT 100% on 4 lpm,  BP 235/135, HR 78. I assisted Dr Gwenlyn FudgeGoldstein with intubating this pt. He successfully orally intubated her on the first attempt using a 7.0 tube. The tube was secured with a commercial tube holder at the 23 cm lip line.

## 2014-08-10 NOTE — Consult Note (Signed)
PULMONARY / CRITICAL CARE MEDICINE   Name: Adriana Mejia MRN: [id number] DOB: [birth year]    ADMISSION DATE:  08/10/2014 CONSULTATION DATE:  08/10/2014  REFERRING MD :  EDP  CHIEF COMPLAINT:  AMS  INITIAL PRESENTATION:  81 y.o. F brought to [facility] ED 5/6 with AMS.  Found to have large ICH with IVH extension and MLS.  In ED, intubated for airway protection and PCCM called for vent management and CVL placement for hypertonic saline.     STUDIES:  CT head 5/6 >>> large parenchymal hemorrhage centered in the left basal ganglia with an estimated volume of 52 cubic cm.  Large volume of IVH within the left lateral ventricle with extension into the right and fourth ventricle.  Mass effect with 6mm MLS with mild subfalcine herniation.  SIGNIFICANT EVENTS: 5/6 - admitted with large ICH with IVH extension, MLS, and subfalcine herniation.   HISTORY OF PRESENT ILLNESS:  Pt is encephalopathic; therefore, this HPI is obtained from chart review. Adriana Mejia is a 81 y.o. F with no PMH who was brought to [facility] ED 5/6 with AMS.  She was in her USOH night prior and just after midnight, told her husband that she was going to bed.  She usually goes to bed at around the same time each night.  Shortly before 1AM, her husband thought he heard her singing in her room.  He went to check on her and found her lying on the floor.  She was talking non-sensibly using numbers instead of words.  She was initially moving but later became much less responsive.  EMS was summoned and on their arrival, SBP was > 200 but pt was able to intermittently follow some commands. In ED, she had CT of the head obtained that revealed a very large left ICH that measured 5.4 x 4.7 . 4.0cm with extension into the left lateral, right and fourth ventricle and associated with 6mm MLS.  She also had mild subfalcine hernation.  Neurosurgery was consulted and felt that pt was not a surgical candidate.  She was intubated for airway protection and PCCM was  called for vent management.  Pt is not on any anticoagulants or antiplatelets.  She has no known hx of uncontrolled hypertension (last checked 2 - 3 years ago and SBP around 130).  She has reportedly been very healthy and in fact has not seen a health care provider for 2 - 3 years.  She is very active at baseline and still performs all ADL's and house work / chores.  She goes on daily walks and exercises daily.  After discussion with family, they decided to change code status to DNR.  They would like CVL placed for hypertonic saline and we will assess pt's response over next 24 - 48 hrs.   PAST MEDICAL HISTORY :   has no past medical history on file.  has no past surgical history on file. Prior to Admission medications   Not on File   No Known Allergies  FAMILY HISTORY:  No family history on file.  SOCIAL HISTORY:  has no tobacco, alcohol, and drug history on file.  REVIEW OF SYSTEMS:  Unable to obtain as pt is encephalopathic.  SUBJECTIVE:   VITAL SIGNS: Temp:  [95.7 F (35.4 C)] 95.7 F (35.4 C) (05/06 0345) Pulse Rate:  [82-96] 86 (05/06 0345) Resp:  [15-24] 20 (05/06 0345) BP: (131-235)/(46-135) 131/46 mmHg (05/06 0345) SpO2:  [100 %] 100 % (05/06 0345) FiO2 (%):  [60 %] 60 % (  05/06 0230) Weight:  [54.432 kg (120 lb)] 54.432 kg (120 lb) (05/06 0235) HEMODYNAMICS:   VENTILATOR SETTINGS: Vent Mode:  [-] PRVC FiO2 (%):  [60 %] 60 % Set Rate:  [16 bmp-20 bmp] 20 bmp Vt Set:  [328 mL-360 mL] 328 mL PEEP:  [5 cmH20] 5 cmH20 Plateau Pressure:  [14 cmH20] 14 cmH20 INTAKE / OUTPUT: Intake/Output      05/05 0701 - 05/06 0700       Urine Occurrence 1 x   Stool Occurrence 1 x   Emesis Occurrence 1 x     PHYSICAL EXAMINATION: General: Elderly female, in NAD. Neuro: Sedated.  Does not follow commands.  Moves LLE.  Withdraws to pain bilateral LE's and UE's. HEENT: Char[place]/AT. Pupils non-responsive, sclerae anicteric. Cardiovascular: RRR, no M/R/G.  Lungs: Respirations even  and unlabored.  CTA bilaterally, No W/R/R. Abdomen: BS x 4, soft, NT/ND.  Musculoskeletal: No gross deformities, no edema.  Skin: Intact, warm, no rashes.  LABS:  CBC  Recent Labs Lab 08/10/14 0208 08/10/14 0213  WBC 9.2  --   HGB 14.4 15.6*  HCT 42.4 46.0  PLT 136*  --    Coag's  Recent Labs Lab 08/10/14 0208  APTT 31  INR 1.04   BMET  Recent Labs Lab 08/10/14 0208 08/10/14 0213  NA 139 140  K 3.1* 3.0*  CL 101 98*  CO2 28  --   BUN 14 19  CREATININE 0.78 0.80  GLUCOSE 137* 137*   Electrolytes  Recent Labs Lab 08/10/14 0208  CALCIUM 8.8*   Sepsis Markers No results for input(s): LATICACIDVEN, PROCALCITON, O2SATVEN in the last 168 hours. ABG  Recent Labs Lab 08/10/14 0315  PHART 7.305*  PCO2ART 57.5*  PO2ART 174.0*   Liver Enzymes  Recent Labs Lab 08/10/14 0208  AST 38  ALT 22  ALKPHOS 81  BILITOT 0.7  ALBUMIN 4.1   Cardiac Enzymes No results for input(s): TROPONINI, PROBNP in the last 168 hours. Glucose  Recent Labs Lab 08/10/14 0210  GLUCAP 135*    Imaging Ct Head Wo Contrast  08/10/2014   CLINICAL DATA:  Code stroke. Patient found on floor. Patient unresponsive  EXAM: CT HEAD WITHOUT CONTRAST  TECHNIQUE: Contiguous axial images were obtained from the base of the skull through the vertex without intravenous contrast.  COMPARISON:  None.  FINDINGS: There is a large intraventricular hemorrhage centered within the left basal ganglia and centrum semiovale. This hemorrhage measures 5.4 x 4.7 x 4.0 cm for calculated volume of 52 mL. Hemorrhage extends into the left lateral ventricle, atria, and temporal horn of the left ventricle. There is 6 mm of rightward midline shift. There is mild subfalcine herniation. The intraventricular hemorrhage extends into the fourth ventricle. Small amount of hemorrhage in the right ventricle. There is mild effacement of the quadrigeminal plate cisterns.  There is generalized cortical atrophy and mild white  matter microvascular disease.  No evidence of skull fracture.  Basal cisterns are patent.  IMPRESSION: 1. Large parenchymal hemorrhage centered in the left basal ganglia with a estimated volume of 52 cubic cm. 2. Large volume of intraventricular hemorrhage within the left lateral ventricle. Hemorrhage also extends into the right ventricle and fourth ventricle. 3. Mass effect with 6 mm of midline shift. Mild subfalcine herniation and effacement of the quadrigeminal plate cisterns. Critical Value/emergent results were called by telephone at the time of interpretation on 08/10/2014 at 2:48 am to Dr. Irene Papamillio, who verbally acknowledged these results.   Electronically Signed  By: Genevive BiStewart  Edmunds M.D.   On: 08/10/2014 02:52   Dg Chest Portable 1 View  08/10/2014   CLINICAL DATA:  Unresponsive.  EXAM: PORTABLE CHEST - 1 VIEW  COMPARISON:  None.  FINDINGS: The endotracheal tube is 5 cm above the carina. The nasogastric tube tip is in the distal esophagus. NG tube should be advanced at least 10 cm for optimal placement.  The lungs are clear. There is no large effusion or pneumothorax. There is mild aortic tortuosity. Hilar and mediastinal contours appear unremarkable. Pulmonary vasculature is normal for a supine radiograph.  IMPRESSION: *Satisfactory ET tube position. *Nasogastric tube does not reach the stomach and should be advanced *No acute cardiopulmonary findings   Electronically Signed   By: Ellery Plunkaniel R Mitchell M.D.   On: 08/10/2014 02:41   Dg Abd Portable 1v  08/10/2014   CLINICAL DATA:  Gastric tube placement  EXAM: PORTABLE ABDOMEN - 1 VIEW  COMPARISON:  None  FINDINGS: The gastric tube extends well into the stomach. Abdominal gas pattern appears unremarkable.  IMPRESSION: Gastric tube extends into the stomach   Electronically Signed   By: Ellery Plunkaniel R Mitchell M.D.   On: 08/10/2014 03:06    ASSESSMENT / PLAN:  NEUROLOGIC A:   Large BG ICH with IV extension, MLS and subfalcine herniation - most likely due to  HTN P:   Sedation:  Propofol gtt / Fentanyl PRN. RASS goal: 0 to -1. Daily WUA. Stroke team following. Continue hypertonic saline.  PULMONARY OETT 5/6 > A: Acute respiratory failure following large ICH P:   Full mechanical support, wean as able. VAP bundle. SBT when able. CXR in AM.  CARDIOVASCULAR CVL pending 5/6 >>> A:  Hypertensive emergency - with subsequent large ICH P:  Continue cardene gtt, goal SBP < 160.  RENAL A:   Hypokalemia Hypocalcemia - ionized calcium 1.06 P:   Potassium 40mEq per tube. 1g Ca gluconate. 3% NS @ 75. Frequent BMP's.  GASTROINTESTINAL A:   GI prophylaxis Nutrition P:   SUP: Pantoprazole. NPO. TF if remains NPO > 24 hours.  HEMATOLOGIC A:   VTE Prophylaxis P:  SCD's only. CBC in AM.  INFECTIOUS A:   No indication for infection P:   Monitor clinically.  ENDOCRINE A:   No known issues  P:   Monitor glucose on BMP.   Family updated: Multiple family members updated at bedside.  Interdisciplinary Family Meeting v Palliative Care Meeting:  Due by: 5/12.   [name] Dorita Rowlands, [place] - [facility] Pager: [phone]  or [phone] 08/10/2014, 3:54 AM

## 2014-08-10 NOTE — ED Provider Notes (Signed)
CSN: [id number]     Arrival date & time 08/10/14  0150 History  This chart was scribed for Pricilla LovelessScott Amberia Bayless, MD by Freida Busmaniana Omoyeni, ED Scribe. [facility] and the patient's care was started 1:50 AM.    Chief Complaint  Patient presents with  . Code Stroke  . GI Bleeding  LEVEL 5 CAVEAT DUE TO ACUITY OF MEDICAL CONDITION   The history is provided by the EMS personnel. No language interpreter was used.   HPI Comments:  Adriana Mejia is a 81 y.o. female brought in by ambulance, who presents to the Emergency Department unresponsive. Per EMS pt was found by her husband on the floor in her bedroom around 0100 this am. Pt was mumbling and using numbers instead of words. EMS reports dark, coffee ground colored emesis on scene and noted little food particles in the emesis. Pt was slightly responsive en route to painful stimuli. EMS reports pt was hypertensive in route with systolic BP in the 200s. Per EMS pt appeared to have left sided gaze and right sided deficit. Progressive decrease in mental status with EMS. EMS placed 18 gauge IV in left forearm and 20 gauge in the the right forearm. Pt last know well ~0000 this am; she was seen watching TV by husband.       History reviewed. No pertinent past medical history. No past surgical history on file. No family history on file. History  Substance Use Topics  . Smoking status: Not on file  . Smokeless tobacco: Not on file  . Alcohol Use: Not on file   OB History    No data available     Review of Systems  Unable to perform ROS     Allergies  Codeine  Home Medications   Prior to Admission medications   Not on File   BP 170/75 mmHg  Pulse 95  Resp 17  Wt 120 lb (54.432 kg)  SpO2 100% Physical Exam  Constitutional: She appears well-developed.  HENT:  Head: Normocephalic and atraumatic.  Right Ear: External ear normal.  Left Ear: External ear normal.  Nose: Nose normal.  Perioral emesis  Eyes: Pupils  are equal, round, and reactive to light.  Left sided nystagmus   Cardiovascular: Normal rate and regular rhythm.   Pulmonary/Chest: Effort normal and breath sounds normal.  Abdominal: Soft. There is no tenderness.  Neurological:  She is unresponsive. GCS eye subscore is 1. GCS verbal subscore is 2. GCS motor subscore is 5. Moves LUE, RLE, LLE to pain, grips hand on left when asked. Right arm flaccid.    Skin: Skin is warm and dry.  Nursing note and vitals reviewed.   ED Course  INTUBATION Date/Time: 08/10/2014 1:58 AM Performed by: Pricilla LovelessGOLDSTON, Shaquan Puerta Authorized by: Pricilla LovelessGOLDSTON, Hakeen Shipes Consent: The procedure was performed in an emergent situation. Patient identity confirmed: anonymous protocol, patient vented/unresponsive Indications: respiratory failure and  airway protection Intubation method: video-assisted Patient status: paralyzed (RSI) Sedatives: etomidate Paralytic: rocuronium Tube size: 7.0 mm Tube type: cuffed Number of attempts: 1 Cords visualized: yes Post-procedure assessment: chest rise and CO2 detector Breath sounds: equal Cuff inflated: yes ETT to lip: 23 cm Tube secured with: ETT holder Chest x-ray interpreted by radiologist. Chest x-ray findings: endotracheal tube in appropriate position Patient tolerance: Patient tolerated the procedure well with no immediate complications Comments: Mild vomit in oropharynx but otherwise straightforward and uncomplicated intubation via Glidescope     DIAGNOSTIC STUDIES:  Oxygen Saturation is 100% on ETT,  normal by my interpretation.    COORDINATION OF CARE:  1:55 AM Discussed risks and benefits of intubation with patient's daughter at [place] who agrees with plan.     Labs Review Labs Reviewed  CBC - Abnormal; Notable for the following:    Platelets 136 (*)    All other components within normal limits  COMPREHENSIVE METABOLIC PANEL - Abnormal; Notable for the following:    Potassium 3.1 (*)    Glucose, Bld 137 (*)     Calcium 8.8 (*)    All other components within normal limits  URINALYSIS, ROUTINE W REFLEX MICROSCOPIC - Abnormal; Notable for the following:    Glucose, UA 100 (*)    Hgb urine dipstick TRACE (*)    All other components within normal limits  I-STAT CHEM 8, ED - Abnormal; Notable for the following:    Potassium 3.0 (*)    Chloride 98 (*)    Glucose, Bld 137 (*)    Calcium, Ion 1.06 (*)    Hemoglobin 15.6 (*)    All other components within normal limits  CBG MONITORING, ED - Abnormal; Notable for the following:    Glucose-Capillary 135 (*)    All other components within normal limits  I-STAT ARTERIAL BLOOD GAS, ED - Abnormal; Notable for the following:    pH, Arterial 7.305 (*)    pCO2 arterial 57.5 (*)    pO2, Arterial 174.0 (*)    Bicarbonate 28.6 (*)    All other components within normal limits  MRSA PCR SCREENING  PROTIME-INR  APTT  DIFFERENTIAL  URINE RAPID DRUG SCREEN (HOSP PERFORMED)  ETHANOL  URINE MICROSCOPIC-ADD ON  BLOOD GAS, ARTERIAL  TRIGLYCERIDES  SODIUM  SODIUM  SODIUM  SODIUM  I-STAT TROPOININ, ED  POC OCCULT BLOOD, ED    Imaging Review Ct Head Wo Contrast  08/10/2014   CLINICAL DATA:  Code stroke. Patient found on floor. Patient unresponsive  EXAM: CT HEAD WITHOUT CONTRAST  TECHNIQUE: Contiguous axial images were obtained from the base of the skull through the vertex without intravenous contrast.  COMPARISON:  None.  FINDINGS: There is a large intraventricular hemorrhage centered within the left basal ganglia and centrum semiovale. This hemorrhage measures 5.4 x 4.7 x 4.0 cm for calculated volume of 52 mL. Hemorrhage extends into the left lateral ventricle, atria, and temporal horn of the left ventricle. There is 6 mm of rightward midline shift. There is mild subfalcine herniation. The intraventricular hemorrhage extends into the fourth ventricle. Small amount of hemorrhage in the right ventricle. There is mild effacement of the quadrigeminal plate cisterns.   There is generalized cortical atrophy and mild white matter microvascular disease.  No evidence of skull fracture.  Basal cisterns are patent.  IMPRESSION: 1. Large parenchymal hemorrhage centered in the left basal ganglia with a estimated volume of 52 cubic cm. 2. Large volume of intraventricular hemorrhage within the left lateral ventricle. Hemorrhage also extends into the right ventricle and fourth ventricle. 3. Mass effect with 6 mm of midline shift. Mild subfalcine herniation and effacement of the quadrigeminal plate cisterns. Critical Value/emergent results were called by telephone at the time of interpretation on 08/10/2014 at 2:48 am to Dr. Irene Pap, who verbally acknowledged these results.   Electronically Signed   By: Genevive Bi M.D.   On: 08/10/2014 02:52   Dg Chest Portable 1 View  08/10/2014   CLINICAL DATA:  Unresponsive.  EXAM: PORTABLE CHEST - 1 VIEW  COMPARISON:  None.  FINDINGS: The endotracheal tube is  5 cm above the carina. The nasogastric tube tip is in the distal esophagus. NG tube should be advanced at least 10 cm for optimal placement.  The lungs are clear. There is no large effusion or pneumothorax. There is mild aortic tortuosity. Hilar and mediastinal contours appear unremarkable. Pulmonary vasculature is normal for a supine radiograph.  IMPRESSION: *Satisfactory ET tube position. *Nasogastric tube does not reach the stomach and should be advanced *No acute cardiopulmonary findings   Electronically Signed   By: Ellery Plunkaniel R Mitchell M.D.   On: 08/10/2014 02:41   Dg Abd Portable 1v  08/10/2014   CLINICAL DATA:  Gastric tube placement  EXAM: PORTABLE ABDOMEN - 1 VIEW  COMPARISON:  None  FINDINGS: The gastric tube extends well into the stomach. Abdominal gas pattern appears unremarkable.  IMPRESSION: Gastric tube extends into the stomach   Electronically Signed   By: Ellery Plunkaniel R Mitchell M.D.   On: 08/10/2014 03:06     EKG Interpretation   Date/Time:  Friday Aug 10 2014 02:13:38  EDT Ventricular Rate:  78 PR Interval:  150 QRS Duration: 103 QT Interval:  451 QTC Calculation: 514 R Axis:   86 Text Interpretation:  Sinus rhythm Biatrial enlargement Consider right  ventricular hypertrophy Prolonged QT interval No old tracing to compare  Confirmed by Daishawn Lauf  MD, Trisa Cranor ([id number]) on 08/10/2014 2:17:44 AM      CRITICAL CARE Performed by: Pricilla LovelessGOLDSTON, Adelai Achey T   Total critical care time: 30 minutes  Critical care time was exclusive of separately billable procedures and treating other patients.  Critical care was necessary to treat or prevent imminent or life-threatening deterioration.  Critical care was time spent personally by me on the following activities: development of treatment plan with patient and/or surrogate as well as nursing, discussions with consultants, evaluation of patient's response to treatment, examination of patient, obtaining history from patient or surrogate, ordering and performing treatments and interventions, ordering and review of laboratory studies, ordering and review of radiographic studies, pulse oximetry and re-evaluation of patient's condition.  MDM   Final diagnoses:  Respiratory failure  Intraparenchymal Hemorrhage  81 year old female who has not seen a primary care physician in a couple years presents altered with depressed GCS and inability to protect airway. Has evidence of vomiting but has not vomited in the ER. Patient's presentation and exam is most consistent with an intra-0.1 hemorrhage prior to CT scan and that she was intubated for airway protection. Uncomplicated intubation. Hypertensive before and after, placed on propofol and nicardipine. Code stroke called because the patient arrived within 2 hours of last seen normal at midnight. CT reveals the very large intraparenchymal hemorrhage with midline shift. Neurology was at the bedside and will admit. I discussed with neurosurgery who reviewed the CT scan and notes that this is too  large and he is unable to help operatively. Critical care consult for vent management. Family aware of grim prognosis.  I personally performed the services described in this documentation, which was scribed in my presence. The recorded information has been reviewed and is accurate.    Pricilla LovelessScott Lauriana Denes, MD 08/10/14 [phone]

## 2014-08-11 ENCOUNTER — Inpatient Hospital Stay (HOSPITAL_COMMUNITY): Payer: Medicare Other

## 2014-08-11 ENCOUNTER — Encounter (HOSPITAL_COMMUNITY): Payer: Self-pay | Admitting: Radiology

## 2014-08-11 DIAGNOSIS — I610 Nontraumatic intracerebral hemorrhage in hemisphere, subcortical: Secondary | ICD-10-CM

## 2014-08-11 DIAGNOSIS — I619 Nontraumatic intracerebral hemorrhage, unspecified: Secondary | ICD-10-CM | POA: Diagnosis present

## 2014-08-11 DIAGNOSIS — I613 Nontraumatic intracerebral hemorrhage in brain stem: Secondary | ICD-10-CM

## 2014-08-11 DIAGNOSIS — I615 Nontraumatic intracerebral hemorrhage, intraventricular: Secondary | ICD-10-CM | POA: Diagnosis present

## 2014-08-11 DIAGNOSIS — Z515 Encounter for palliative care: Secondary | ICD-10-CM

## 2014-08-11 DIAGNOSIS — I10 Essential (primary) hypertension: Secondary | ICD-10-CM

## 2014-08-11 LAB — BLOOD GAS, ARTERIAL
Acid-base deficit: 3.8 mmol/L — ABNORMAL HIGH (ref 0.0–2.0)
Bicarbonate: 20.9 mEq/L (ref 20.0–24.0)
Drawn by: 29017
FIO2: 0.3 %
O2 Saturation: 97.9 %
PATIENT TEMPERATURE: 98.6
PCO2 ART: 39.6 mmHg (ref 35.0–45.0)
PEEP/CPAP: 5 cmH2O
PH ART: 7.343 — AB (ref 7.350–7.450)
RATE: 20 resp/min
TCO2: 22.2 mmol/L (ref 0–100)
VT: 350 mL
pO2, Arterial: 109 mmHg — ABNORMAL HIGH (ref 80.0–100.0)

## 2014-08-11 LAB — CBC
HCT: 42.3 % (ref 36.0–46.0)
HEMOGLOBIN: 14 g/dL (ref 12.0–15.0)
MCH: 29 pg (ref 26.0–34.0)
MCHC: 33.1 g/dL (ref 30.0–36.0)
MCV: 87.6 fL (ref 78.0–100.0)
Platelets: 141 10*3/uL — ABNORMAL LOW (ref 150–400)
RBC: 4.83 MIL/uL (ref 3.87–5.11)
RDW: 16.5 % — AB (ref 11.5–15.5)
WBC: 8.2 10*3/uL (ref 4.0–10.5)

## 2014-08-11 LAB — SODIUM
SODIUM: 158 mmol/L — AB (ref 135–145)
SODIUM: 160 mmol/L — AB (ref 135–145)

## 2014-08-11 LAB — BASIC METABOLIC PANEL
BUN: 14 mg/dL (ref 6–20)
CO2: 23 mmol/L (ref 22–32)
Calcium: 9.2 mg/dL (ref 8.9–10.3)
Chloride: >130 mmol/L (ref 101–111)
Creatinine, Ser: 0.81 mg/dL (ref 0.44–1.00)
GFR calc non Af Amer: >60 mL/min (ref >60)
Glucose, Bld: 167 mg/dL — ABNORMAL HIGH (ref 70–99)
POTASSIUM: 3.6 mmol/L (ref 3.5–5.1)
Sodium: 159 mmol/L — ABNORMAL HIGH (ref 135–145)

## 2014-08-11 LAB — PHOSPHORUS: Phosphorus: 2.4 mg/dL — ABNORMAL LOW (ref 2.5–4.6)

## 2014-08-11 LAB — TSH: TSH: 1.538 u[IU]/mL (ref 0.350–4.500)

## 2014-08-11 LAB — MAGNESIUM: MAGNESIUM: 1.9 mg/dL (ref 1.7–2.4)

## 2014-08-11 MED ORDER — PANTOPRAZOLE SODIUM 40 MG PO PACK
40.0000 mg | PACK | ORAL | Status: DC
  Administered 2014-08-11: 40 mg
  Filled 2014-08-11 (×2): qty 20

## 2014-08-11 MED ORDER — FENTANYL BOLUS VIA INFUSION
25.0000 ug | INTRAVENOUS | Status: DC | PRN
  Administered 2014-08-14: 25 ug via INTRAVENOUS
  Filled 2014-08-11: qty 50

## 2014-08-11 MED ORDER — AMLODIPINE BESYLATE 10 MG PO TABS
10.0000 mg | ORAL_TABLET | Freq: Every day | ORAL | Status: DC
  Filled 2014-08-11: qty 1

## 2014-08-11 MED ORDER — ARTIFICIAL TEARS OP OINT
TOPICAL_OINTMENT | OPHTHALMIC | Status: DC | PRN
  Filled 2014-08-11: qty 3.5

## 2014-08-11 MED ORDER — IOHEXOL 350 MG/ML SOLN
50.0000 mL | Freq: Once | INTRAVENOUS | Status: AC | PRN
Start: 2014-08-11 — End: 2014-08-11
  Administered 2014-08-11: 50 mL via INTRAVENOUS

## 2014-08-11 MED ORDER — GLYCOPYRROLATE 0.2 MG/ML IJ SOLN
0.2000 mg | INTRAMUSCULAR | Status: DC | PRN
  Administered 2014-08-11: 0.2 mg via INTRAVENOUS
  Filled 2014-08-11 (×2): qty 1

## 2014-08-11 MED ORDER — LISINOPRIL 20 MG PO TABS
20.0000 mg | ORAL_TABLET | Freq: Every day | ORAL | Status: DC
  Filled 2014-08-11: qty 1

## 2014-08-11 MED ORDER — LORAZEPAM 2 MG/ML IJ SOLN
1.0000 mg | Freq: Two times a day (BID) | INTRAMUSCULAR | Status: DC
  Administered 2014-08-11 – 2014-08-14 (×3): 1 mg via INTRAVENOUS
  Filled 2014-08-11 (×3): qty 1

## 2014-08-11 MED ORDER — EMPTY CONTAINERS FLEXIBLE MISC
50.0000 ug/h | Status: DC
  Filled 2014-08-11 (×3): qty 100

## 2014-08-11 MED ORDER — LORAZEPAM 2 MG/ML IJ SOLN: 1.0000 mg | INTRAMUSCULAR | Status: DC | PRN

## 2014-08-11 NOTE — Progress Notes (Signed)
Stroke Team Progress Note  SUBJECTIVE Her Husband, daughters x 2 and son-in-law are at the bedside. Had long discussion with them regarding pt prognosis and calculated ICH score with them. She is able to barely open eyes on repeated voice stimulation, but ICH score is 4 and one month mortality rate is 97%. She is on 3% saline and Na 159 this am.  OBJECTIVE Most recent Vital Signs: Filed Vitals:   08/11/14 1000 08/11/14 1015 08/11/14 1030 08/11/14 1216  BP: 154/79 133/85 148/72   Pulse: 100 89 96 95  Temp: 98.2 F (36.8 C) 98.2 F (36.8 C) 98.2 F (36.8 C)   TempSrc:      Resp: Height:      Weight:      SpO2: 98% 98% 99%    CBG (last 3)   Recent Labs  08/10/14 0210  GLUCAP 135*    IV Fluid Intake:   . fentaNYL infusion INTRAVENOUS Stopped (08/11/14 0643)  . niCARDipine 2.5 mg/hr (08/11/14 0845)  . propofol (DIPRIVAN) infusion Stopped (08/10/14 0900)  . sodium chloride (hypertonic) Stopped (08/11/14 0453)    MEDICATIONS  . antiseptic oral rinse  7 mL Mouth Rinse QID  . chlorhexidine  15 mL Mouth Rinse BID  . pantoprazole sodium  40 mg Per Tube Q24H  . senna-docusate  1 tablet Oral BID   PRN:  acetaminophen **OR** acetaminophen, fentaNYL, fentaNYL (SUBLIMAZE) injection, labetalol  Diet:  Diet NPO time specified   Activity:  Bedrest  DVT Prophylaxis: SCDs CLINICALLY SIGNIFICANT STUDIES Basic Metabolic Panel:  Recent Labs Lab 08/10/14 0208 08/10/14 0213  08/11/14 0100 08/11/14 0530  NA 139 140  < > 158* 159*  K 3.1* 3.0*  --   --  3.6  CL 101 98*  --   --  >130*  CO2 28  --   --   --  23  GLUCOSE 137* 137*  --   --  167*  BUN 14 19  --   --  14  CREATININE 0.78 0.80  --   --  0.81  CALCIUM 8.8*  --   --   --  9.2  MG  --   --   --   --  1.9  PHOS  --   --   --   --  2.4*  < > = values in this interval not displayed. Liver Function Tests:   Recent Labs Lab 08/10/14 0208  AST 38  ALT 22  ALKPHOS 81  BILITOT 0.7  PROT 6.5  ALBUMIN 4.1    CBC:   Recent Labs Lab 08/10/14 0208 08/10/14 0213 08/11/14 0530  WBC 9.2  --  8.2  NEUTROABS 5.8  --   --   HGB 14.4 15.6* 14.0  HCT 42.4 46.0 42.3  MCV 85.5  --  87.6  PLT 136*  --  141*   Coagulation:   Recent Labs Lab 08/10/14 0208  LABPROT 13.7  INR 1.04   Cardiac Enzymes: No results for input(s): CKTOTAL, CKMB, CKMBINDEX, TROPONINI in the last 168 hours. Urinalysis:   Recent Labs Lab 08/10/14 0300  COLORURINE YELLOW  LABSPEC 1.006  PHURINE 8.0  GLUCOSEU 100*  HGBUR TRACE*  BILIRUBINUR NEGATIVE  KETONESUR NEGATIVE  PROTEINUR NEGATIVE  UROBILINOGEN 0.2  NITRITE NEGATIVE  LEUKOCYTESUR NEGATIVE   Lipid Panel    Component Value Date/Time   TRIG 64 08/10/2014 0530   HgbA1C No results found for: HGBA1C  Urine Drug Screen:  Component Value Date/Time   LABOPIA NONE DETECTED 08/10/2014 0300   COCAINSCRNUR NONE DETECTED 08/10/2014 0300   LABBENZ NONE DETECTED 08/10/2014 0300   AMPHETMU NONE DETECTED 08/10/2014 0300   THCU NONE DETECTED 08/10/2014 0300   LABBARB NONE DETECTED 08/10/2014 0300    Alcohol Level:   Recent Labs Lab 08/10/14 0208  ETH <5    Ct Head Wo Contrast  08/10/2014  IMPRESSION: 1. Large parenchymal hemorrhage centered in the left basal ganglia with a estimated volume of 52 cubic cm. 2. Large volume of intraventricular hemorrhage within the left lateral ventricle. Hemorrhage also extends into the right ventricle and fourth ventricle. 3. Mass effect with 6 mm of midline shift. Mild subfalcine herniation and effacement of the quadrigeminal plate cisterns.    Dg Chest Port 1 View  08/11/2014    IMPRESSION: Support equipment appears satisfactorily positioned.  The lungs are clear.     08/10/2014   IMPRESSION: *New right jugular central line.  No pneumothorax. *NG tube is been advanced and now extends into the stomach. *Satisfactory ET tube position. *No acute cardiopulmonary findings.   CT repeat - not able to perform as pt not able  to lie flat (bradycardia)  2D Echocardiogram - pending  EKG  Sinus rhythm Biatrial enlargement Consider right ventricular hypertrophy Prolonged QT interval No old tracing to compare  Physical Exam   Frail elderly caucasian lady not in distress. Intubated. Afebrile. Head is nontraumatic. Neck is supple without bruit.  Cardiac exam no murmur or gallop. Lungs are clear to auscultation. Distal pulses are well felt.  Neurological Exam: Intubated. Not on any sedation. Open eyes on repeated voice stimulation. Left gaze deviation. It appears 4 mm equal sluggishly reactive. Right corneal reflexes present and left is sluggish. Dolls eye movements are sluggish. Fundi were not visualized. Does not follow any commands. Mild right lower facial weakness. Dense right hemiplegia and will not withdraw even to painful stimuli. Purposeful antigravity movements on the left UE. Left LE trace withdraw with pain stimulation. Right plantar upgoing left downgoing.  ASSESSMENT Adriana Mejia is a 81 y.o. female presenting with sudden onset of unresponsiveness and hypertensive urgency. Neurological exam shows comatose state with dense right hemiplegia and CT scan shows a large left frontal parenchymal hematoma ( volume 52 cubic cc) with intraventricular extension, subfalcine brain herniation, cytotoxic edema.  ICH with ventricular extension:  Dominant left frontal large hemorrhage with ventricular extension and ICH score is 4.   CT head as above  Not able to repeat CT head yet due to bradycardia on lying flat  2D Echo  pending  HgbA1c pending  SCDs for VTE prophylaxis  Diet NPO time specified   no antithrombotic prior to admission, now on no antithrombotic  Discussed with family in length about poor prognosis and they would like palliative care consult  Palliative consult placed.  Cerebral edema  On 3% saline - turned off due to high Na  Na goal 150-160  Na Q6h  Not surgical  candidate  Diabetes  HgbA1c pending goal < 7.0  Controlled  CBG monitoring  Hypertension  Home meds:  none. Currently on cardene drip BP goal < 160 Added norvasc and lisinopril to wean off cardene  Stable  Other Stroke Risk Factors  Advanced age  Other Active Problems    Other Pertinent History  Poor prognosis with ICH score 4  Hospital day # 1  This patient is critically ill due to left large hemorrhage with ventricular extension and at  significant risk of neurological worsening, death form cerebral edema and brain herniation as well as re-bleed. This patient's care requires constant monitoring of vital signs, hemodynamics, respiratory and cardiac monitoring, review of multiple databases, neurological assessment, discussion with family, other specialists and medical decision making of high complexity. Had long discussion with daughters and husband regarding her prognosis. Pt wishes no artifical prolongation of life and requested quality of life. Family agree with palliative care consult. I spent 45 minutes of neurocritical care time in the care of this patient.   Adriana PlanJindong Berlynn Warsame, MD PhD Stroke Neurology 08/11/2014 2:42 PM   To contact Stroke Continuity provider, please refer to [email or web address]. After hours, contact General Neurology

## 2014-08-11 NOTE — Progress Notes (Signed)
PULMONARY / CRITICAL CARE MEDICINE   Name: Adriana Mejia MRN: [id number] DOB: 1927/07/12    ADMISSION DATE:  08/10/2014 CONSULTATION DATE:  08/11/2014  REFERRING MD :  EDP  CHIEF COMPLAINT:  AMS  INITIAL PRESENTATION:  81 y.o. F [facility] with AMS.  Found to have large ICH with IVH extension and MLS.  In ED, intubated for airway protection and PCCM called for vent management and CVL placement for hypertonic saline.     STUDIES:  CT head 5/6 >>> large parenchymal hemorrhage centered in the left basal ganglia with an estimated volume of 52 cubic cm.  Large volume of IVH within the left lateral ventricle with extension into the right and fourth ventricle.  Mass effect with 6mm MLS with mild subfalcine herniation.  SIGNIFICANT EVENTS: 5/6 - admitted with large ICH with IVH extension, MLS, and subfalcine herniation.  SUBJECTIVE:  Tolerates pressure support  VITAL SIGNS: Temp:  [97.6 F (36.4 C)-100.6 F (38.1 C)] 97.9 F (36.6 C) (05/07 0700) Pulse Rate:  [57-106] 102 (05/07 0700) Resp:  [16-29] 20 (05/07 0700) BP: (113-188)/(55-103) 152/78 mmHg (05/07 0700) SpO2:  [96 %-100 %] 99 % (05/07 0700) FiO2 (%):  [30 %] 30 % (05/07 0503) VENTILATOR SETTINGS: Vent Mode:  [-] PRVC FiO2 (%):  [30 %] 30 % Set Rate:  [20 bmp] 20 bmp Vt Set:  [350 mL] 350 mL PEEP:  [5 cmH20] 5 cmH20 Plateau Pressure:  [13 cmH20-17 cmH20] 13 cmH20 INTAKE / OUTPUT: Intake/Output      05/06 0701 - 05/07 0700 05/07 0701 - 05/08 0700   I.V. (mL/kg) 2328.4 (50.8)    IV Piggyback 30    Total Intake(mL/kg) 2358.4 (51.5)    Urine (mL/kg/hr) 3090 (2.8)    Emesis/NG output 0 (0)    Stool     Total Output 3090     Net -731.7            PHYSICAL EXAMINATION: General: no distress Neuro: moves left arm HEENT: ETT in place Cardiovascular: regular Lungs: no wheeze Abdomen: soft, non tender Musculoskeletal: No edema Skin: Intact, warm, no rashes  LABS:  CBC  Recent Labs Lab 08/10/14 0208  08/10/14 0213 08/11/14 0530  WBC 9.2  --  8.2  HGB 14.4 15.6* 14.0  HCT 42.4 46.0 42.3  PLT 136*  --  141*   Coag's  Recent Labs Lab 08/10/14 0208  APTT 31  INR 1.04   BMET  Recent Labs Lab 08/10/14 0208 08/10/14 0213  08/10/14 1841 08/11/14 0100 08/11/14 0530  NA 139 140  < > 152* 158* 159*  K 3.1* 3.0*  --   --   --  3.6  CL 101 98*  --   --   --  >130*  CO2 28  --   --   --   --  23  BUN 14 19  --   --   --  14  CREATININE 0.78 0.80  --   --   --  0.81  GLUCOSE 137* 137*  --   --   --  167*  < > = values in this interval not displayed.   Electrolytes  Recent Labs Lab 08/10/14 0208 08/11/14 0530  CALCIUM 8.8* 9.2  MG  --  1.9  PHOS  --  2.4*   ABG  Recent Labs Lab 08/10/14 0315 08/10/14 0458 08/11/14 0312  PHART 7.305* 7.412 7.343*  PCO2ART 57.5* 43.8 39.6  PO2ART 174.0* 218.0* 109*  Liver Enzymes  Recent Labs Lab 08/10/14 0208  AST 38  ALT 22  ALKPHOS 81  BILITOT 0.7  ALBUMIN 4.1   Glucose  Recent Labs Lab 08/10/14 0210  GLUCAP 135*    Imaging Ct Head Wo Contrast  08/10/2014   CLINICAL DATA:  Code stroke. Patient found on floor. Patient unresponsive  EXAM: CT HEAD WITHOUT CONTRAST  TECHNIQUE: Contiguous axial images were obtained from the base of the skull through the vertex without intravenous contrast.  COMPARISON:  None.  FINDINGS: There is a large intraventricular hemorrhage centered within the left basal ganglia and centrum semiovale. This hemorrhage measures 5.4 x 4.7 x 4.0 cm for calculated volume of 52 mL. Hemorrhage extends into the left lateral ventricle, atria, and temporal horn of the left ventricle. There is 6 mm of rightward midline shift. There is mild subfalcine herniation. The intraventricular hemorrhage extends into the fourth ventricle. Small amount of hemorrhage in the right ventricle. There is mild effacement of the quadrigeminal plate cisterns.  There is generalized cortical atrophy and mild white matter  microvascular disease.  No evidence of skull fracture.  Basal cisterns are patent.  IMPRESSION: 1. Large parenchymal hemorrhage centered in the left basal ganglia with a estimated volume of 52 cubic cm. 2. Large volume of intraventricular hemorrhage within the left lateral ventricle. Hemorrhage also extends into the right ventricle and fourth ventricle. 3. Mass effect with 6 mm of midline shift. Mild subfalcine herniation and effacement of the quadrigeminal plate cisterns. Critical Value/emergent results were called by telephone at the time of interpretation on 08/10/2014 at 2:48 am to Dr. Irene Pap, who verbally acknowledged these results.   Electronically Signed   By: Genevive Bi M.D.   On: 08/10/2014 02:52   Dg Chest Port 1 View  08/11/2014   CLINICAL DATA:  Acute respiratory failure  EXAM: PORTABLE CHEST - 1 VIEW  COMPARISON:  08/10/2014  FINDINGS: The endotracheal tube is 4.7 cm above the carina. There is a right jugular central line extending into the SVC. The nasogastric tube extends into the stomach. There is hyperinflation. The lungs are clear. There is no pneumothorax.  IMPRESSION: Support equipment appears satisfactorily positioned.  The lungs are clear.   Electronically Signed   By: Ellery Plunk M.D.   On: 08/11/2014 05:47   Dg Chest Port 1 View  08/10/2014   CLINICAL DATA:  Central line placement  EXAM: PORTABLE CHEST - 1 VIEW  COMPARISON:  08/10/2014 at 2:09  FINDINGS: There is a new right jugular central line with tip in the low SVC. The endotracheal tube tip is 3.9 cm above the carina. The nasogastric tube has been advanced and now extends into the stomach, off the inferior edge of the image. The lungs are grossly clear. There is no large effusion. There is no pneumothorax.  IMPRESSION: *New right jugular central line.  No pneumothorax. *NG tube is been advanced and now extends into the stomach. *Satisfactory ET tube position. *No acute cardiopulmonary findings.   Electronically Signed    By: Ellery Plunk M.D.   On: 08/10/2014 05:56   Dg Chest Portable 1 View  08/10/2014   CLINICAL DATA:  Unresponsive.  EXAM: PORTABLE CHEST - 1 VIEW  COMPARISON:  None.  FINDINGS: The endotracheal tube is 5 cm above the carina. The nasogastric tube tip is in the distal esophagus. NG tube should be advanced at least 10 cm for optimal placement.  The lungs are clear. There is no large effusion or pneumothorax. There is  mild aortic tortuosity. Hilar and mediastinal contours appear unremarkable. Pulmonary vasculature is normal for a supine radiograph.  IMPRESSION: *Satisfactory ET tube position. *Nasogastric tube does not reach the stomach and should be advanced *No acute cardiopulmonary findings   Electronically Signed   By: Ellery Plunkaniel R Mitchell M.D.   On: 08/10/2014 02:41   Dg Abd Portable 1v  08/10/2014   CLINICAL DATA:  Gastric tube placement  EXAM: PORTABLE ABDOMEN - 1 VIEW  COMPARISON:  None  FINDINGS: The gastric tube extends well into the stomach. Abdominal gas pattern appears unremarkable.  IMPRESSION: Gastric tube extends into the stomach   Electronically Signed   By: Ellery Plunkaniel R Mitchell M.D.   On: 08/10/2014 03:06    ASSESSMENT / PLAN:  NEUROLOGIC A:   Large BG ICH with IV extension, MLS and subfalcine herniation - most likely due to HTN. P:   Per neuro  PULMONARY ETT 5/6 > A: Acute respiratory failure following large ICH. P:   Pressure support wean >> mental status precludes extubation  CARDIOVASCULAR Rt IJ CVL 5/06 >> A:  Hypertensive emergency - with subsequent large ICH. P:  Continue cardene gtt, goal SBP < 160  RENAL A:  Medically induced hypernatremia. P:   Hypernatremia protocol per neuro  GASTROINTESTINAL A:   Nutrition. P:   Tube feeds if family wishes to continue aggressive therapy Protonix for SUP  HEMATOLOGIC A:   No acute issues. P:  SCD F/u CBC intermittently  INFECTIOUS A:   No indication for infection. P:   Monitor clinically.  ENDOCRINE A:    No known issues. P:   Monitor glucose on BMP.  Goals of care >> No CPR, no defibrillation.  Family to decide about vent withdrawal and transition to comfort measures.  Coralyn HellingVineet Dyamon Sosinski, MD [facility] Pulmonary/Critical Care 08/11/2014, 7:53 AM Pager:  [phone] After 3pm call: [phone]

## 2014-08-11 NOTE — Progress Notes (Signed)
New Admission Note:  Pt transferred to the unit   Arrival Method: Bed Mental Orientation: Non-responsive, resting comfortably Telemetry: N/A Assessment: Completed Skin: Intact IV: Intact Pain: Pain Ad scale negative for discomfort Tubes: None Safety Measures: Safety Fall Prevention Plan in place Admission:  [address] Orientation: Patient has been orientated to the room, unit and staff.  Family: N/A  Orders have been reviewed and implemented. Will continue to monitor the patient.   Burley SaverKami Gracyn Santillanes, RN-BC Phone: [phone]

## 2014-08-11 NOTE — Progress Notes (Signed)
PT Cancellation Note and D/C  Patient Details Name: Adriana Mejia MRN: [id number] DOB: 1927-09-06   Cancelled Treatment:    Reason Eval/Treat Not Completed: Medical issues which prohibited therapy (Nursing asked PT to sign off.  )Thanks.    Tawni MillersWhite, Beronica Lansdale F 08/11/2014, 9:47 AM Eber Jonesawn Cleveland Paiz,PT Acute Rehabilitation [phone] (pager)

## 2014-08-11 NOTE — Consult Note (Signed)
Consultation Note Date: 08/11/2014   Patient Name: Adriana Mejia  DOB: [birth year]  MRN: [id number]  Age / Sex: 81 y.o., female   PCP: Lilian Coma, MD Referring Physician: Rosalin Hawking, MD  Reason for Consultation: Terminal care  Palliative Care Assessment and Plan Summary of Established Goals of Care and Medical Treatment Preferences   81 yo woman with large ICH (Large parenchymal hemorrhage centered in the left basal ganglia with a estimated volume of 52 cubic cm. 2. Large volume of intraventricular hemorrhage within the left lateral ventricle. Hemorrhage also extends into the right ventricle and fourth ventricle. 3. Mass effect with 6 mm of midline shift. Mild subfalcine herniation and effacement of the quadrigeminal plate cisterns).  Patient had previously expressed to family a desire to not live a debilitated life. Her prognosis is extremely poor for meaningful recovery.  A decision has been made to transition to full comfort care.   PROCEEDING WITH TERMINAL WEAN THIS EVENING  Palliative Care Discussion Held Today PMT met with her husband, multiple children and other family members  Code Status/Advance Care Planning: DNR  Symptom Management:   Fentanyl Infusion for comfort  Ativan scheduled and PRN for Seizure prophylaxis and agitation  Robinul for secretions  Discontinued all non-comfort related meds.  Psycho-social/Spiritual:   Support System: Very strong and loving family.  Desire for further Chaplaincy support:yes  Prognosis: < 2 weeks  Discharge Planning:  Hospital death antcipated       Chief Complaint/History of Present Illness: ICH  Primary Diagnoses  Present on Admission:  . Stroke due to intracerebral hemorrhage  Palliative Review of Systems:  I have reviewed the medical record, interviewed the patient and family, and examined the patient. The following aspects are pertinent.  History reviewed. No pertinent past medical history. History    Social History  . Marital Status: Married    Spouse Name: N/A  . Number of Children: N/A  . Years of Education: N/A   Social History Main Topics  . Smoking status: Not on file  . Smokeless tobacco: Not on file  . Alcohol Use: Not on file  . Drug Use: Not on file  . Sexual Activity: Not on file   Other Topics Concern  . None   Social History Narrative   History reviewed. No pertinent family history. Scheduled Meds: . antiseptic oral rinse  7 mL Mouth Rinse QID  . chlorhexidine  15 mL Mouth Rinse BID  . LORazepam  1 mg Intravenous BID   Continuous Infusions: . fentaNYL infusion INTRAVENOUS     PRN Meds:.[DISCONTINUED] acetaminophen **OR** acetaminophen, artificial tears, fentaNYL, fentaNYL (SUBLIMAZE) injection, glycopyrrolate, LORazepam Medications Prior to Admission:  Prior to Admission medications   Not on File   No Known Allergies CBC:    Component Value Date/Time   WBC 8.2 08/11/2014 0530   HGB 14.0 08/11/2014 0530   HCT 42.3 08/11/2014 0530   PLT 141* 08/11/2014 0530   MCV 87.6 08/11/2014 0530   NEUTROABS 5.8 08/10/2014 0208   LYMPHSABS 3.0 08/10/2014 0208   MONOABS 0.4 08/10/2014 0208   EOSABS 0.0 08/10/2014 0208   BASOSABS 0.0 08/10/2014 0208   Comprehensive Metabolic Panel:    Component Value Date/Time   NA 160* 08/11/2014 1434   K 3.6 08/11/2014 0530   CL >130* 08/11/2014 0530   CO2 23 08/11/2014 0530   BUN 14 08/11/2014 0530   CREATININE 0.81 08/11/2014 0530   GLUCOSE 167* 08/11/2014 0530   CALCIUM 9.2 08/11/2014 0530   AST  38 08/10/2014 0208   ALT 22 08/10/2014 0208   ALKPHOS 81 08/10/2014 0208   BILITOT 0.7 08/10/2014 0208   PROT 6.5 08/10/2014 0208   ALBUMIN 4.1 08/10/2014 0208    Physical Exam: Vital Signs: BP 163/92 mmHg  Pulse 109  Temp(Src) 99.9 F (37.7 C) (Axillary)  Resp 25  Ht $R'4\' 11"'fM$  (1.499 m)  Wt 45.8 kg (100 lb 15.5 oz)  BMI 20.38 kg/m2  SpO2 97% SpO2: SpO2: 97 % O2 Device: O2 Device: Ventilator O2 Flow Rate:    Intake/output summary:  Intake/Output Summary (Last 24 hours) at 08/11/14 1855 Last data filed at 08/11/14 0700  Gross per 24 hour  Intake 1315.24 ml  Output   1090 ml  Net 225.24 ml   LBM:   Baseline Weight: Weight: 54.432 kg (120 lb) Most recent weight: Weight: 45.8 kg (100 lb 15.5 oz)  Exam Findings:           Palliative Performance Scale: 10              Additional Data Reviewed: Recent Labs     08/10/14  0208  08/10/14  0213   08/11/14  0530  08/11/14  1434  WBC  9.2   --    --   8.2   --   HGB  14.4  15.6*   --   14.0   --   PLT  136*   --    --   141*   --   NA  139  140   < >  159*  160*  BUN  14  19   --   14   --   CREATININE  0.78  0.80   --   0.81   --    < > = values in this interval not displayed.     Time In: 4:30  Time Out: 6:30 Time Total: 120 min  Greater than 50%  of this time was spent counseling and coordinating care related to the above assessment and plan.  Signed by: Roma Schanz, DO  08/11/2014, 6:55 PM  Please contact Palliative Medicine Team phone at [phone] for questions and concerns.

## 2014-08-11 NOTE — Progress Notes (Signed)
While attempting CT scan pt began to experience bradycardia after position on CT table, before scan had begun. The bradycardia resolved spontaneously after raising the patient's head. The patient was transferred back to the bed with head raised and transported back to the unit. The patient continued to respond to pain with movement on her left side and her pupils remained 2mm and sluggish.

## 2014-08-11 NOTE — Evaluation (Signed)
SLP Cancellation Note  Patient Details Name: Adriana CantorJanet R Mejia MRN: [id number] DOB: [birth year]   Cancelled treatment:       Reason Eval/Treat Not Completed: Patient not medically ready (pt remains on vent, will continue efforts)  Donavan Burnetamara Koua Deeg, MS [facility] SLP [phone]

## 2014-08-11 NOTE — Progress Notes (Signed)
**Note De-identified Romney Compean Obfuscation** Patient extubated per "withdrawal of care" protocol.  Patient tolerated well.   

## 2014-08-11 NOTE — Progress Notes (Signed)
Dr. Cyril Mourningamillo paged and notified regarding pt bradycardia and inability to obtain CT.

## 2014-08-12 DIAGNOSIS — Z515 Encounter for palliative care: Secondary | ICD-10-CM | POA: Diagnosis not present

## 2014-08-12 MED ORDER — SODIUM CHLORIDE 0.9 % IJ SOLN
10.0000 mL | Freq: Two times a day (BID) | INTRAMUSCULAR | Status: DC
Start: 2014-08-12 — End: 2014-08-14
  Administered 2014-08-13: 10 mL

## 2014-08-12 MED ORDER — SODIUM CHLORIDE 0.9 % IJ SOLN
10.0000 mL | INTRAMUSCULAR | Status: DC | PRN
  Administered 2014-08-12 (×2): 10 mL
  Filled 2014-08-12 (×2): qty 40

## 2014-08-12 NOTE — Progress Notes (Signed)
Stroke Team Progress Note  SUBJECTIVE Her Husband, daughters x 2 and son-in-law are at the bedside. Pt undergoing comfort care now. On fentanyl drip and comfortable now. Has bradypnea.   OBJECTIVE Most recent Vital Signs: Filed Vitals:   08/11/14 1845 08/11/14 1900 08/11/14 2112 08/12/14 0921  BP: 147/86 159/87 149/81 126/68  Pulse: 108 102 104 85  Temp: 100 F (37.8 C)  98.7 F (37.1 C) 99.1 F (37.3 C)  TempSrc:   Axillary Axillary  Resp: 17 22 22 24   Height:      Weight:      SpO2: 98% 99% 99% 96%   CBG (last 3)   Recent Labs  08/10/14 0210  GLUCAP 135*    IV Fluid Intake:   . fentaNYL infusion INTRAVENOUS      MEDICATIONS  . LORazepam  1 mg Intravenous BID  . sodium chloride  10-40 mL Intracatheter Q12H   PRN:  [DISCONTINUED] acetaminophen **OR** acetaminophen, artificial tears, fentaNYL, fentaNYL (SUBLIMAZE) injection, glycopyrrolate, LORazepam, sodium chloride  Diet:      Activity:  Bedrest  DVT Prophylaxis: SCDs CLINICALLY SIGNIFICANT STUDIES Basic Metabolic Panel:  Recent Labs Lab 08/10/14 0208 08/10/14 0213  08/11/14 0530 08/11/14 1434  NA 139 140  < > 159* 160*  K 3.1* 3.0*  --  3.6  --   CL 101 98*  --  >130*  --   CO2 28  --   --  23  --   GLUCOSE 137* 137*  --  167*  --   BUN 14 19  --  14  --   CREATININE 0.78 0.80  --  0.81  --   CALCIUM 8.8*  --   --  9.2  --   MG  --   --   --  1.9  --   PHOS  --   --   --  2.4*  --   < > = values in this interval not displayed. Liver Function Tests:   Recent Labs Lab 08/10/14 0208  AST 38  ALT 22  ALKPHOS 81  BILITOT 0.7  PROT 6.5  ALBUMIN 4.1   CBC:   Recent Labs Lab 08/10/14 0208 08/10/14 0213 08/11/14 0530  WBC 9.2  --  8.2  NEUTROABS 5.8  --   --   HGB 14.4 15.6* 14.0  HCT 42.4 46.0 42.3  MCV 85.5  --  87.6  PLT 136*  --  141*   Coagulation:   Recent Labs Lab 08/10/14 0208  LABPROT 13.7  INR 1.04   Cardiac Enzymes: No results for input(s): CKTOTAL, CKMB, CKMBINDEX,  TROPONINI in the last 168 hours. Urinalysis:   Recent Labs Lab 08/10/14 0300  COLORURINE YELLOW  LABSPEC 1.006  PHURINE 8.0  GLUCOSEU 100*  HGBUR TRACE*  BILIRUBINUR NEGATIVE  KETONESUR NEGATIVE  PROTEINUR NEGATIVE  UROBILINOGEN 0.2  NITRITE NEGATIVE  LEUKOCYTESUR NEGATIVE   Lipid Panel    Component Value Date/Time   TRIG 64 08/10/2014 0530   HgbA1C No results found for: HGBA1C  Urine Drug Screen:      Component Value Date/Time   LABOPIA NONE DETECTED 08/10/2014 0300   COCAINSCRNUR NONE DETECTED 08/10/2014 0300   LABBENZ NONE DETECTED 08/10/2014 0300   AMPHETMU NONE DETECTED 08/10/2014 0300   THCU NONE DETECTED 08/10/2014 0300   LABBARB NONE DETECTED 08/10/2014 0300    Alcohol Level:   Recent Labs Lab 08/10/14 0208  ETH <5   I have personally reviewed the radiological images below and agree with  the radiology interpretations.  Ct Head Wo Contrast  08/10/2014  IMPRESSION: 1. Large parenchymal hemorrhage centered in the left basal ganglia with a estimated volume of 52 cubic cm. 2. Large volume of intraventricular hemorrhage within the left lateral ventricle. Hemorrhage also extends into the right ventricle and fourth ventricle. 3. Mass effect with 6 mm of midline shift. Mild subfalcine herniation and effacement of the quadrigeminal plate cisterns.    Dg Chest Port 1 View  08/11/2014    IMPRESSION: Support equipment appears satisfactorily positioned.  The lungs are clear.     08/10/2014   IMPRESSION: *New right jugular central line.  No pneumothorax. *NG tube is been advanced and now extends into the stomach. *Satisfactory ET tube position. *No acute cardiopulmonary findings.   CT repeat - not able to perform as pt not able to lie flat (bradycardia)  EKG  Sinus rhythm Biatrial enlargement Consider right ventricular hypertrophy Prolonged QT interval No old tracing to compare  Physical Exam  - limited due to comfort care Frail elderly caucasian lady not in  distress. Intubated. Afebrile. Head is nontraumatic. Bradypnea. Distal pulses are well felt.  Neurological Exam: extubated on fentanyl drip. Comfortable. Not open eyes on voice stimulation. Left gaze deviation. Does not follow any commands. Mild right lower facial weakness. No spontaneous movement of all extremities.  ASSESSMENT Ms. Adriana Mejia is a 81 y.o. female presenting with sudden onset of unresponsiveness and hypertensive urgency. Neurological exam shows comatose state with dense right hemiplegia and CT scan shows a large left frontal parenchymal hematoma ( volume 52 cubic cc) with intraventricular extension, subfalcine brain herniation, cytotoxic edema.  ICH with ventricular extension:  Dominant left frontal large hemorrhage with ventricular extension and ICH score is 4.   CT head as above  Not able to repeat CT head yet due to bradycardia on lying flat  HgbA1c pending  no antithrombotic prior to admission, now on no antithrombotic  Discussed with family in length about poor prognosis and they would like palliative care consult  Pt on comfort care now.  Cerebral edema  Off 3% saline  On comfort care now  Not surgical candidate  Diabetes  HgbA1c pending goal < 7.0  Not monitored due to comfort care  Hypertension  Monitoring as per comfort care protocol  Other Stroke Risk Factors  Advanced age  Other Active Problems  Central line still in place - will d/c  Other Pertinent History  Poor prognosis with ICH score 4  Hospital day # 2  Marvel PlanJindong Dionta Larke, MD PhD Stroke Neurology 08/12/2014 12:03 PM   To contact Stroke Continuity provider, please refer to [email or web address]. After hours, contact General Neurology

## 2014-08-12 NOTE — Progress Notes (Signed)
Patient ID: Adriana Mejia, female   DOB: October 30, 1927, 81 y.o.   MRN: [id number]   Daily Progress Note   Patient Name: Adriana CantorJanet R Sutter       Date: 08/12/2014 DOB: October 30, 1927  Age: 81 y.o. MRN#: [id number] Attending Physician: Marvel PlanJindong Xu, MD Primary Care Physician: Malka SoJOBE,DANIEL B., MD Admit Date: 08/10/2014  Reason for Consultation/Follow-up: Terminal care  Subjective: Pt moved out of ICU. Unresponsive. Comfortable . Remains on Fentanyl 100 mcg/hr Interval Events: Extubated without incident and moved out of ICU  Length of Stay: 2 days  Current Medications: Scheduled Meds:  . LORazepam  1 mg Intravenous BID  . sodium chloride  10-40 mL Intracatheter Q12H    Continuous Infusions: . fentaNYL infusion INTRAVENOUS      PRN Meds: [DISCONTINUED] acetaminophen **OR** acetaminophen, artificial tears, fentaNYL, fentaNYL (SUBLIMAZE) injection, glycopyrrolate, LORazepam, sodium chloride  Palliative Performance Scale: 10%     Vital Signs: BP 126/68 mmHg  Pulse 85  Temp(Src) 99.1 F (37.3 C) (Axillary)  Resp 24  Ht 4\' 11"  (1.499 m)  Wt 45.8 kg (100 lb 15.5 oz)  BMI 20.38 kg/m2  SpO2 96% SpO2: SpO2: 96 % O2 Device: O2 Device: Nasal Cannula O2 Flow Rate: O2 Flow Rate (L/min): 2 L/min  Intake/output summary:  Intake/Output Summary (Last 24 hours) at 08/12/14 1021 Last data filed at 08/12/14 0217  Gross per 24 hour  Intake      0 ml  Output    825 ml  Net   -825 ml   LBM:   Baseline Weight: Weight: 54.432 kg (120 lb) Most recent weight: Weight: 45.8 kg (100 lb 15.5 oz)  Physical Exam: General: Elderly female in no distress. Unresponsive Resp: No apnea. Lungs clear. No secretions Cardiac: RRR Extremities: No mottling         Additional Data Reviewed: Recent Labs     08/10/14  0208  08/10/14  0213   08/11/14  0530  08/11/14  1434  WBC  9.2   --    --   8.2   --   HGB  14.4  15.6*   --   14.0   --   PLT  136*   --    --   141*   --   NA  139  140   < >  159*  160*  BUN  14   19   --   14   --   CREATININE  0.78  0.80   --   0.81   --    < > = values in this interval not displayed.     Problem List:  Patient Active Problem List   Diagnosis Date Noted  . ICH (intracerebral hemorrhage)   . HTN (hypertension), malignant   . IVH (intraventricular hemorrhage)   . Stroke due to intracerebral hemorrhage 08/10/2014  . Respiratory failure   . HYPERTENSION, BENIGN SYSTEMIC 01/12/2006  . RHINITIS, ALLERGIC 01/12/2006  . PNEUMONIA, UNSPECIFIED 01/12/2006  . OSTEOPOROSIS, UNSPECIFIED 01/12/2006     Palliative Care Assessment & Plan    Code Status:  DNR  Goals of Care:  DNR  Complete comfort care at this point.  Desire for further Chaplaincy support: no  3. Symptom Management:  Dyspnea: Doing well on fentanyl gtt at 100 mcg/hr. Reviewed with RN titration parameters as well as clinical indications for titration  Pain: No non verbal s/s of distress. Cont fentanyl infusion unchanged for now  Secretions: No upper airway congestion heard. Cont robinul prn  Seizure prophylaxis: No seizure activity observed. Cont scheduled ativan and prn ativan   5. Prognosis: Hours - Days  5. Discharge Planning: anticipate hospital death. If she stablaizes and in-pateint hospice facility would be a good option   Care plan was discussed with Rn, daughter and pt's husband  Thank you for allowing the Palliative Medicine Team to assist in the care of this patient.   Time In: 0830 Time Out: 0900 Total Time 30 Prolonged Time Billed  no     Greater than 50%  of this time was spent counseling and coordinating care related to the above assessment and plan.   Irean HongSarah Grace Evon Dejarnett, NP  08/12/2014, 10:21 AM  Please contact Palliative Medicine Team phone at [phone] for questions and concerns.

## 2014-08-13 LAB — HEMOGLOBIN A1C
Hgb A1c MFr Bld: 5.8 % — ABNORMAL HIGH (ref 4.8–5.6)
MEAN PLASMA GLUCOSE: 120 mg/dL

## 2014-08-13 NOTE — Progress Notes (Signed)
Daily Progress Note   Patient Name: Adriana Mejia       Date: 08/13/2014 DOB: [birth year]  Age: 6Abran Cantor86 y.o. MRN#: [id number] Attending Physician: Adriana PlanJindong Xu, Adriana Mejia Primary Care Physician: Adriana Mejia,Adriana B., Adriana Mejia Admit Date: 08/10/2014  Reason for Consultation/Follow-up: Disposition and Terminal care  Subjective: Unresponsive, periods of apnea, now with left eye gaze deviation  Interval Events: Transitioned to comfort care on 5/7 Length of Stay: 3 days  Current Medications: Scheduled Meds:  . LORazepam  1 mg Intravenous BID  . sodium chloride  10-40 mL Intracatheter Q12H    Continuous Infusions: . fentaNYL infusion INTRAVENOUS 125 mcg/hr (08/12/14 1237)    PRN Meds: [DISCONTINUED] acetaminophen **OR** acetaminophen, artificial tears, fentaNYL, fentaNYL (SUBLIMAZE) injection, glycopyrrolate, LORazepam, sodium chloride  Palliative Performance Scale: 10%     Vital Signs: BP 141/76 mmHg  Pulse 106  Temp(Src) 99.6 F (37.6 C) (Axillary)  Resp 20  Ht 4\' 11"  (1.499 m)  Wt 45.8 kg (100 lb 15.5 oz)  BMI 20.38 kg/m2  SpO2 98% SpO2: SpO2: 98 % O2 Device: O2 Device: Nasal Cannula O2 Flow Rate: O2 Flow Rate (L/min): 2 L/min  Intake/output summary:  Intake/Output Summary (Last 24 hours) at 08/13/14 2201 Last data filed at 08/13/14 1700  Gross per 24 hour  Intake      0 ml  Output    600 ml  Net   -600 ml   LBM:   Baseline Weight: Weight: 54.432 kg (120 lb) Most recent weight: Weight: 45.8 kg (100 lb 15.5 oz)  Physical Exam: Pale, dry oral mucosa, breathing rhoncorous but no grimace-snoring and sedated s/p terminal extubation        Additional Data Reviewed: Recent Labs     08/11/14  0530  08/11/14  1434  WBC  8.2   --   HGB  14.0   --   PLT  141*   --   NA  159*  160*  BUN  14   --   CREATININE  0.81   --      Problem List:  Patient Active Problem List   Diagnosis Date Noted  . Palliative care encounter   . ICH (intracerebral hemorrhage)   . HTN (hypertension),  malignant   . IVH (intraventricular hemorrhage)   . Stroke due to intracerebral hemorrhage 08/10/2014  . Respiratory failure   . HYPERTENSION, BENIGN SYSTEMIC 01/12/2006  . RHINITIS, ALLERGIC 01/12/2006  . PNEUMONIA, UNSPECIFIED 01/12/2006  . OSTEOPOROSIS, UNSPECIFIED 01/12/2006     Palliative Care Assessment & Mejia    Code Status:  DNR  Goals of Care:  Full comfort, dignity at EOL  Desire for further Chaplaincy support:yes- great appreciation to our chaplain service Toni Amend(Courtney) for working with this loving family.  3. Symptom Management:  Fentanyl infusion titration started at time of terminal wean-started at 25 now increased to 13225mcg/hr with last adjustment to basal rate made 5/8 to 110825mcg/hr- minimal bolus dosing needed  Scheduled Ativan for seizure prophylaxis and PRN for signs of seizure or agitation  Glycopyrolate for secretions  4. Palliative Prophylaxis:  Lacri-lube, bowel regimen, oral care, comfort cart  5. Prognosis: < 2 weeks  5. Discharge Planning: Hospice facility vs hospital death- based on her vitals signs and breathing pattern she would be stable for transport.   Care Mejia was discussed with PMT IDT.  Thank you for allowing the Palliative Medicine Team to assist in the care of this patient.  Time: 9:45PM-10PM 15 minutes Greater than 50%  of this time  was spent counseling and coordinating care related to the above assessment and Mejia.   Edsel PetrinElizabeth L Lavora Brisbon, DO  08/13/2014, 10:01 PM  Please contact Palliative Medicine Team phone at [phone] for questions and concerns.

## 2014-08-13 NOTE — Care Management Note (Signed)
Case Management Note  Patient Details  Name: Adriana Mejia MRN: [id number] Date of Birth: 1927/11/24  Subjective/Objective:                    Action/Plan:   Expected Discharge Date:                  Expected Discharge Plan:  Hospice Medical Facility  In-House Referral:  Clinical Social Work  Discharge planning Services  NA  Post Acute Care Choice:  Hospice Choice offered to:  Spouse  DME Arranged:    DME Agency:     HH Arranged:    HH Agency:     Status of Service:  In process, will continue to follow  Medicare Important Message Given:  Yes Date Medicare IM Given:  08/13/14 Medicare IM give by:  Johny Shockheryl Yuette Putnam RN MPH, case manager Date Additional Medicare IM Given:    Additional Medicare Important Message give by:     If discussed at Long Length of Stay Meetings, dates discussed:    Additional Comments:  [name], Dynastie Knoop U, RN 08/13/2014, 11:14 AM

## 2014-08-13 NOTE — Progress Notes (Signed)
Chaplain responded to page for family requesting support. Family's central question is "How can we imagine life without her?" Chaplain introduced coping mechanisms and encouraged storytelling. Chaplain offered prayer, emotional support, and grief support. Chaplain recommends continued support from unit chaplain.    08/13/14 1200  Clinical Encounter Type  Visited With Family  Visit Type Follow-up;Spiritual support  Referral From Family  Consult/Referral To Chaplain  Spiritual Encounters  Spiritual Needs Emotional;Grief support;Prayer  Stress Factors  Family Stress Factors Loss  Einar Nolasco, Mayer MaskerCourtney F, Chaplain 08/13/2014 12:20 PM

## 2014-08-13 NOTE — Progress Notes (Signed)
Stroke Team Progress Note  SUBJECTIVE Multiple family members at the bedside. They report pt's breathing continues to slow. Central line now out. They feel she is comfortable.  OBJECTIVE Most recent Vital Signs: Filed Vitals:   08/11/14 2112 08/12/14 0921 08/12/14 2026 08/13/14 1107  BP: 149/81 126/68 146/68 141/76  Pulse: 104 85 89 106  Temp: 98.7 F (37.1 C) 99.1 F (37.3 C) 99 F (37.2 C) 99.6 F (37.6 C)  TempSrc: Axillary Axillary  Oral  Resp: 22 24 22 20   Height:      Weight:      SpO2: 99% 96% 97% 98%   IV Fluid Intake:   . fentaNYL infusion INTRAVENOUS 125 mcg/hr (08/12/14 1237)    MEDICATIONS  . LORazepam  1 mg Intravenous BID  . sodium chloride  10-40 mL Intracatheter Q12H   PRN:  [DISCONTINUED] acetaminophen **OR** acetaminophen, artificial tears, fentaNYL, fentaNYL (SUBLIMAZE) injection, glycopyrrolate, LORazepam, sodium chloride  Ct Head Wo Contrast 08/10/2014  IMPRESSION: 1. Large parenchymal hemorrhage centered in the left basal ganglia with a estimated volume of 52 cubic cm. 2. Large volume of intraventricular hemorrhage within the left lateral ventricle. Hemorrhage also extends into the right ventricle and fourth ventricle. 3. Mass effect with 6 mm of midline shift. Mild subfalcine herniation and effacement of the quadrigeminal plate cisterns.    Dg Chest Port 1 View 08/11/2014    IMPRESSION: Support equipment appears satisfactorily positioned.  The lungs are clear.    08/10/2014   IMPRESSION: *New right jugular central line.  No pneumothorax. *NG tube is been advanced and now extends into the stomach. *Satisfactory ET tube position. *No acute cardiopulmonary findings.   CT repeat - not able to perform as pt not able to lie flat (bradycardia)  EKG  Sinus rhythm. Biatrial enlargement. Consider right ventricular hypertrophy. Prolonged QT interval. No old tracing to compare  Physical Exam  - limited due to comfort care Frail elderly caucasian lady not in distress.  Intubated. Afebrile. Head is nontraumatic. Bradypnea. Distal pulses are well felt. Neurological Exam: extubated on fentanyl drip. Comfortable. Not open eyes on voice stimulation. Left gaze deviation. Does not follow any commands. Mild right lower facial weakness. No spontaneous movement of all extremities.   ASSESSMENT Adriana Mejia is a 81 y.o. female presenting with sudden onset of unresponsiveness and hypertensive urgency. Neurological exam shows comatose state with dense right hemiplegia and CT scan shows a large left frontal parenchymal hematoma ( volume 52 cubic cc) with intraventricular extension, subfalcine brain herniation, cytotoxic edema.  ICH with ventricular extension:  Dominant left frontal large hemorrhage with ventricular extension and ICH score is 4.   no antithrombotic prior to admission, now on no antithrombotic   Respiratory status continues to decline with bradyapnea  Discussed with family in length about poor prognosis and they have decided on comfort care  Continue fentanyl drip for comfort  Will ask palliative care to consider taking her onto their service. Doubt she will survive transport to residential hospice.  Cerebral edema  Off 3% saline  On comfort care now  Not surgical candidate  Diabetes  HgbA1c 5.8, at goal < 7.0  Not monitored due to comfort care  Hypertension  Monitoring as per comfort care protocol  Other Stroke Risk Factors  Advanced age  Hospital day # 3  Thurman CoyerBIBY,SHARON  [facility] See Amion for Pager information 08/13/2014 1:02 PM   81 yo F with large left frontal hemorrhage and ICH score 4. Poor prognosis. On  full comfort care. Palliative following. Likely need inpt hospice.   Marvel PlanJindong Surafel Hilleary, MD PhD Stroke Neurology 08/13/2014 5:39 PM   To contact Stroke Continuity provider, please refer to [email or web address]. After hours, contact General Neurology

## 2014-08-13 NOTE — Progress Notes (Signed)
Chaplain responded to spiritual care consult from palliative care. Chaplain introduced herself to pt's two daughters, Alvino Chapelllen and Independent HillBeverly, at bedside. Pt daughters described they mother as very active. One daughter wondered if her mother pushed herself too hard. Daughter had questions about mother's pain at time of incident. Chaplain encouraged family to ask this of MD. Chaplain offered word of prayer and blessing. Chaplain recommends follow up by unit chaplain and encouraged family to ask nurse to page chaplain as needed.    08/13/14 0900  Clinical Encounter Type  Visited With Patient and family together  Visit Type Initial;Spiritual support  Referral From Palliative care team  Consult/Referral To Chaplain  Spiritual Encounters  Spiritual Needs Emotional;Grief support;Prayer  Stress Factors  Family Stress Factors Loss  Adriana Mejia, Mayer MaskerCourtney F, Chaplain 08/13/2014 9:48 AM

## 2014-08-13 NOTE — Clinical Social Work Note (Addendum)
CSW received consult regarding residential hospice placement for patient. CSW visited room and tlaked with daughter Adriana Mejia regarding consult. Call made by Ms. Mejia to her father and CSW talked with him regarding Hospice placement and offered choice. [facility]. Call made to nurse liaison Adriana Mejia with [facility] to give referral. Adriana Mejia will f/u with family today and she reported that [facility] does have beds.  Talked with Adriana Mejia at approx. 4:17 pm regarding patient. She will have to meet with family on Tuesday and can then take patient on Tuesday, if she meets criteria. Adriana Mejia provided with phone number for husband and daughter Adriana Mejia.    Genelle BalVanessa Sostenes Kauffmann, MSW, LCSW Licensed Clinical Social Worker Clinical Social Work Department [facility] [phone]

## 2014-08-13 NOTE — Progress Notes (Signed)
Nutrition Brief Note  Chart reviewed. Pt is now comfort care.  No further nutrition interventions warranted at this time.  Please re-consult as needed.   Marijean NiemannStephanie La, MS, RD, LDN Pager # [phone] After hours/ weekend pager # [phone]

## 2014-08-14 DIAGNOSIS — G936 Cerebral edema: Secondary | ICD-10-CM

## 2014-08-14 NOTE — Care Management Note (Signed)
Case Management Note  Patient Details  Name: Adriana Mejia MRN: [id number] Date of Birth: 10-11-1927  Subjective/Objective:                    Action/Plan:   Expected Discharge Date:                  Expected Discharge Plan:  Hospice Medical Facility  In-House Referral:  Clinical Social Work  Discharge planning Services  NA  Post Acute Care Choice:  Hospice Choice offered to:  Spouse  DME Arranged:    DME Agency:     HH Arranged:    HH Agency:     Status of Service:  Complete  10-30-2014  Medicare Important Message Given:  Yes Date Medicare IM Given:  08/13/14 Medicare IM give by:  Johny Shockheryl Aquarius Tremper RN MPH, case manager Date Additional Medicare IM Given:    Additional Medicare Important Message give by:     If discussed at Long Length of Stay Meetings, dates discussed:    Additional Comments:  [name], [name], RN 04/29/2014, 2:48 PM

## 2014-08-14 NOTE — Progress Notes (Signed)
95 cc of remaining fentanyl wasted in sink in med room. Witnessed by Meridee ScoreAndy Brake, RN.  Carrie MewJasmine Charlesetta Milliron, RN

## 2014-08-14 NOTE — Discharge Summary (Signed)
Stroke Discharge Summary  Patient ID: Adriana Mejia    l   MRN: [id number]      DOB: March 24, 1928  Date of Admission: 08/10/2014 Date of Discharge: Oct 29, 2014  Attending Physician:  Marvel PlanJindong Elzie Knisley, MD, Stroke MD  Consulting Physician(s):   Anderson MaltaElizabeth Golding, MD (Palliative [facility]), Cyril Mourningakesh Alva, MD (pulmonary/intensive care) Patient's PCP:  Malka SoJOBE,DANIEL B., MD  DISCHARGE DIAGNOSIS:  Principal Problem:   ICH (intracerebral hemorrhage) - L frontal with IVH Active Problems:   Respiratory failure secondary to hemorrhage   Right hemiparesis   Comatose State   HTN (hypertension), malignant   IVH (intraventricular hemorrhage)   Palliative care encounter   Cerebral edema  BMI: Body mass index is 20.38 kg/(m^2).  History reviewed. No pertinent past medical history. History reviewed. No pertinent past surgical history.    Medication List    Notice    You have not been prescribed any medications.      LABORATORY STUDIES CBC    Component Value Date/Time   WBC 8.2 08/11/2014 0530   RBC 4.83 08/11/2014 0530   HGB 14.0 08/11/2014 0530   HCT 42.3 08/11/2014 0530   PLT 141* 08/11/2014 0530   MCV 87.6 08/11/2014 0530   MCH 29.0 08/11/2014 0530   MCHC 33.1 08/11/2014 0530   RDW 16.5* 08/11/2014 0530   LYMPHSABS 3.0 08/10/2014 0208   MONOABS 0.4 08/10/2014 0208   EOSABS 0.0 08/10/2014 0208   BASOSABS 0.0 08/10/2014 0208   CMP    Component Value Date/Time   NA 160* 08/11/2014 1434   K 3.6 08/11/2014 0530   CL >130* 08/11/2014 0530   CO2 23 08/11/2014 0530   GLUCOSE 167* 08/11/2014 0530   BUN 14 08/11/2014 0530   CREATININE 0.81 08/11/2014 0530   CALCIUM 9.2 08/11/2014 0530   PROT 6.5 08/10/2014 0208   ALBUMIN 4.1 08/10/2014 0208   AST 38 08/10/2014 0208   ALT 22 08/10/2014 0208   ALKPHOS 81 08/10/2014 0208   BILITOT 0.7 08/10/2014 0208   GFRNONAA >60 08/11/2014 0530   GFRAA >60 08/11/2014 0530   COAGS Lab Results  Component Value Date   INR 1.04 08/10/2014   Lipid  Panel    Component Value Date/Time   TRIG 64 08/10/2014 0530   HgbA1C  Lab Results  Component Value Date   HGBA1C 5.8* 08/11/2014   Cardiac Panel (last 3 results) No results for input(s): CKTOTAL, CKMB, TROPONINI, RELINDX in the last 72 hours. Urinalysis    Component Value Date/Time   COLORURINE YELLOW 08/10/2014 0300   APPEARANCEUR CLEAR 08/10/2014 0300   LABSPEC 1.006 08/10/2014 0300   PHURINE 8.0 08/10/2014 0300   GLUCOSEU 100* 08/10/2014 0300   HGBUR TRACE* 08/10/2014 0300   BILIRUBINUR NEGATIVE 08/10/2014 0300   KETONESUR NEGATIVE 08/10/2014 0300   PROTEINUR NEGATIVE 08/10/2014 0300   UROBILINOGEN 0.2 08/10/2014 0300   NITRITE NEGATIVE 08/10/2014 0300   LEUKOCYTESUR NEGATIVE 08/10/2014 0300   Urine Drug Screen     Component Value Date/Time   LABOPIA NONE DETECTED 08/10/2014 0300   COCAINSCRNUR NONE DETECTED 08/10/2014 0300   LABBENZ NONE DETECTED 08/10/2014 0300   AMPHETMU NONE DETECTED 08/10/2014 0300   THCU NONE DETECTED 08/10/2014 0300   LABBARB NONE DETECTED 08/10/2014 0300    Alcohol Level    Component Value Date/Time   ETH <5 08/10/2014 0208    SIGNIFICANT DIAGNOSTIC STUDIES Ct Head Wo Contrast 08/10/2014 IMPRESSION: 1. Large parenchymal hemorrhage centered in the left basal ganglia with a estimated volume of 52 cubic cm.  2. Large volume of intraventricular hemorrhage within the left lateral ventricle. Hemorrhage also extends into the right ventricle and fourth ventricle. 3. Mass effect with 6 mm of midline shift. Mild subfalcine herniation and effacement of the quadrigeminal plate cisterns.   CT repeat - not able to perform as pt not able to lie flat (bradycardia)  2D echo canceled  Dg Chest Port 1 View 08/11/2014 IMPRESSION: Support equipment appears satisfactorily positioned. The lungs are clear.  08/10/2014 IMPRESSION: *New right jugular central line. No pneumothorax. *NG tube is been advanced and now extends into the stomach. *Satisfactory ET  tube position. *No acute cardiopulmonary findings.   EKG Sinus rhythm. Biatrial enlargement. Consider right ventricular hypertrophy. Prolonged QT interval. No old tracing to compare     HISTORY OF PRESENT ILLNESS Adriana Mejia is an 81 y.o. female with apparently no pertinent past medical history, hasn't seen a physician in many years, brought in by EMS after being found unresponsive by her husband. She comes from home, was reportedly seen normal by husband at midnight (LKW 08/10/2014 at midnight), but approximately 1 hour later was found poorly responsive by her husband and EMS was summoned. As per EMS, patient had SBP>210 but was still able to follow some commands. Upon arrival to ED patient with dense right hemiparesis, progressive decline in mental status, remained markedly hypertensive, was intubated and started on nicardipine drip. When seen by neurology, patient was on propofol. CT brain showed a very large left BG ICH that measures 5.4 x 4.7 x 4.0 cm for calculated volume of 52 mL, with extension into the lateral and fourth ventricle, 6 mm of left to right midline shift, and mild subfalcine herniation. Platelets 136, PTT 31, and INR 1.04. She was admitted to the neuro ICU for further evaluation and treatment.    HOSPITAL COURSE Adriana Mejia is a 81 y.o. female presenting with sudden onset of unresponsiveness and hypertensive urgency. Neurological exam shows comatose state with dense right hemiplegia and CT scan shows a large left frontal parenchymal hematoma ( volume 52 cubic cc) with intraventricular extension, subfalcine brain herniation, cytotoxic edema.  ICH with ventricular extension: Dominant left frontal large hemorrhage with ventricular extension, cytotoxic cerebral edema; ICH score is 4.   Resultant comatose state, right hemiplegia, respiratory failure  Was not on antithrombotic prior to admission  HgbA1c 5.8  Poor neuro prognosis for recovery  Option given for  Neurosurgery consult, surgery would save her life but unlikely to alter underlying significant neurological disability. After consideration , family decided not to pursue neurosurgical option   Palliative care consulted  Discussed with family in length about poor prognosis and they have decided on comfort care  Started on fentanyl drip for comfort. Will stop at discharge. Other comfort care measures to be instituted by Hospice Facility  Respiratory status continues to decline with bradyapnea  Bed obtained at [facility]. Will discharge there.  Respiratory Failure  Secondary to ICH  Intubated in ED  Extubated upon decision for comfort care  Cerebral edema Induced hypernatremia to decrease cerebral edema  Not surgical candidate  She was given 23.4% saline bolus and placed on 3% hypertonic saline drip for induced hypernatremia to decrease cerebral edema.  Off 3% saline once placed on comfort care now  Malignant Hypertension  BP 211/114 on arrival  Aggressive BP control with cardene and hydralazine  Monitoring as per comfort care protocol  Other Stroke Risk Factors  Advanced age   DISCHARGE EXAM Blood pressure 97/47, pulse  116, temperature 101 F (38.3 C), temperature source Oral, resp. rate 16, height  (1.499 m), weight 45.8 kg (100 lb 15.5 oz), SpO2 94 %. Frail elderly caucasian lady not in distress. Intubated. Afebrile. Head is nontraumatic. Bradypnea. Distal pulses are well felt. Neurological Exam: extubated on fentanyl drip. Comfortable. Not open eyes on voice stimulation. Left gaze deviation. Does not follow any commands. Mild right lower facial weakness. No spontaneous movement of all extremities.  Discharge Diet     NPO  DISCHARGE PLAN  Disposition:  [facility] for comfort care   35 minutes were spent preparing discharge.  Rhoderick Moody [facility] See Amion for Pager information 15-Aug-2014 11:30 AM   I, the attending  vascular neurologist, have personally obtained a history, examined the patient, evaluated laboratory data, individually viewed imaging studies and agree with radiology interpretations. Together with the NP/PA, we formulated the assessment and plan of care which reflects our mutual decision.  I have made any additions or clarifications directly to the above note and agree with the findings and plan as currently documented.   Marvel Plan, MD PhD Stroke Neurology 08/15/2014 4:48 PM

## 2014-08-14 NOTE — Progress Notes (Signed)
Pt was picked up by EMS to be transported to Hospice facility. Family aware and present. 22 g IV in RFA left in place and pt received 25 mcg bolus of fentanyl [facility]. Pt appeared to be comfortable.    Carrie MewJasmine Grier Czerwinski, RN

## 2014-09-05 DEATH — deceased

## 2016-03-09 IMAGING — CR DG CHEST 1V PORT
1 series · 1 of 1 positions shown · non-contrast
Comparison: 08/10/2014

CLINICAL DATA: Acute respiratory failure

EXAM:
PORTABLE CHEST - 1 VIEW

[AP]
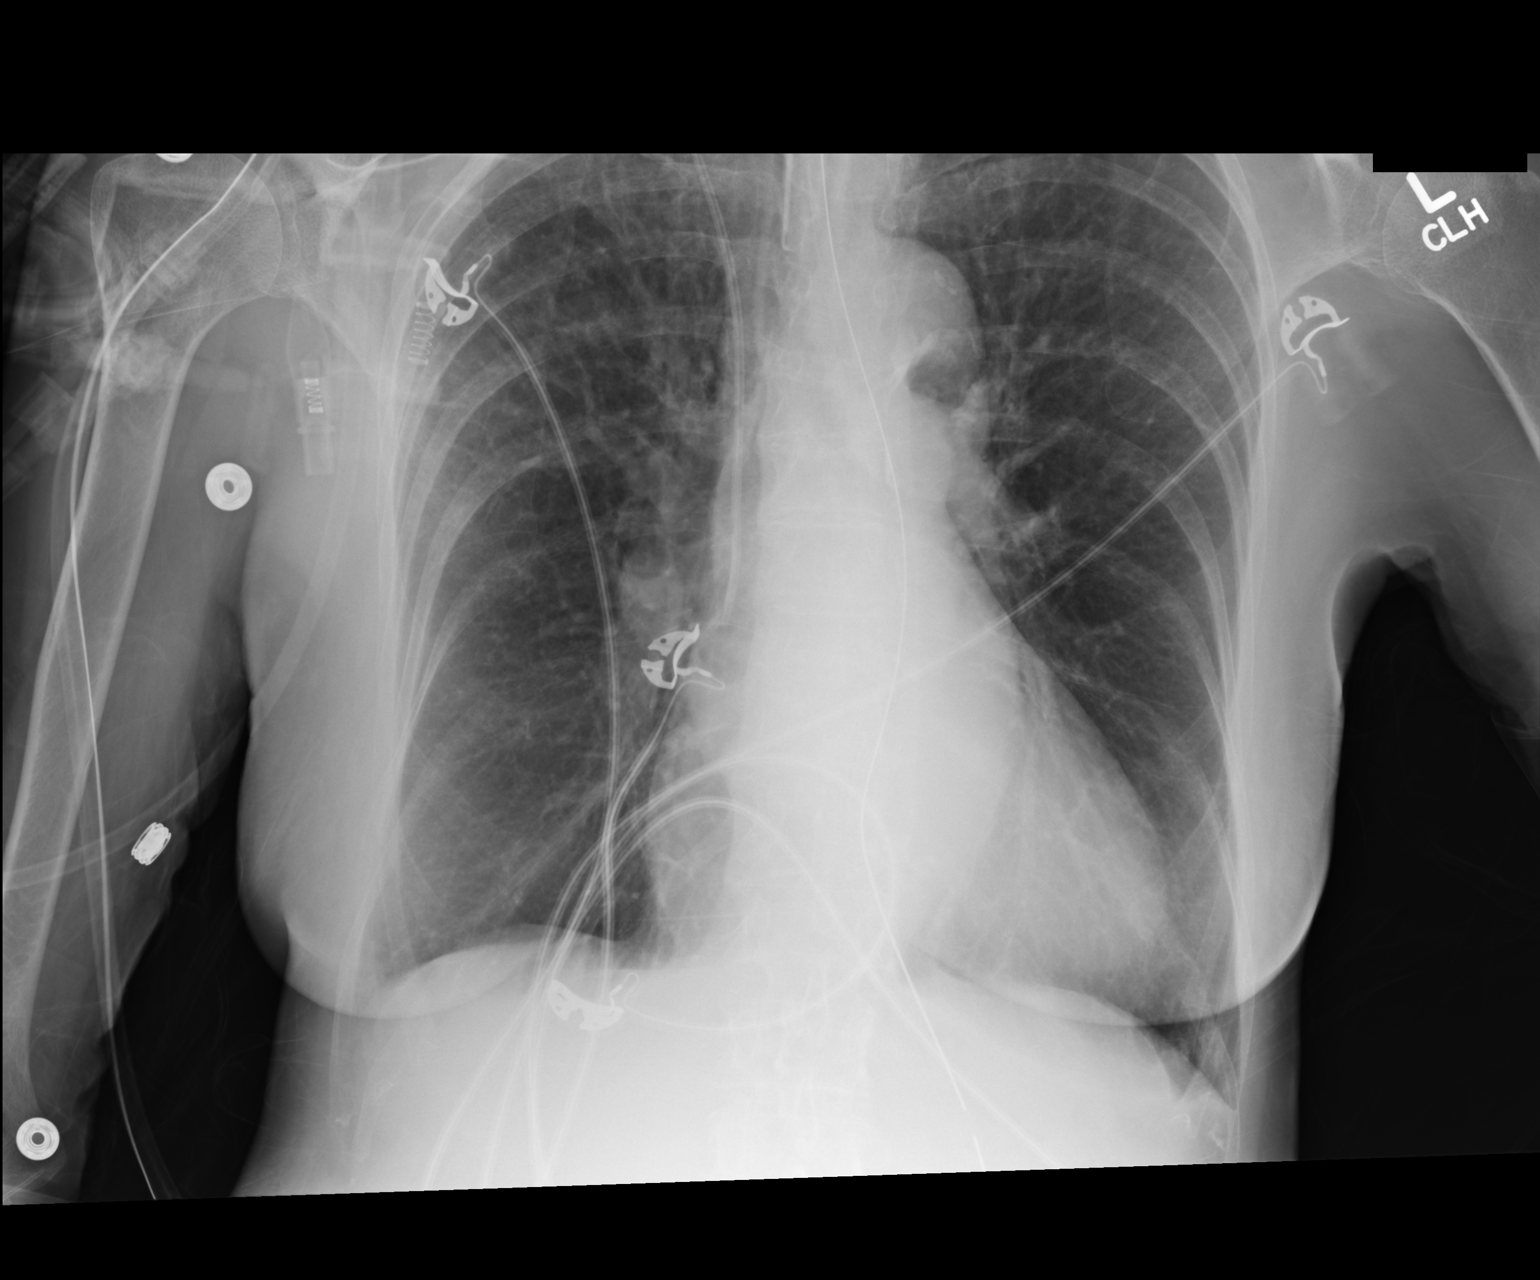

[1 of 1 positions shown; findings below may reference images not displayed]

FINDINGS: The endotracheal tube is 4.7 cm above the carina. There is a right
jugular central line extending into the SVC. The nasogastric tube
extends into the stomach. There is hyperinflation. The lungs are
clear. There is no pneumothorax.
IMPRESSION: Support equipment appears satisfactorily positioned.

The lungs are clear.
